# Patient Record
Sex: Female | Born: 1935 | Race: White | Hispanic: No | Smoking: Former smoker
Health system: Southern US, Community
[De-identification: ages and names within clinical notes are randomized; demographics above are authoritative.]

## PROBLEM LIST (undated history)

## (undated) ENCOUNTER — Emergency Department (HOSPITAL_COMMUNITY): Admission: EM | Payer: Medicare Other | Source: Home / Self Care

## (undated) DIAGNOSIS — E785 Hyperlipidemia, unspecified: Secondary | ICD-10-CM

## (undated) DIAGNOSIS — R21 Rash and other nonspecific skin eruption: Secondary | ICD-10-CM

## (undated) DIAGNOSIS — S8010XA Contusion of unspecified lower leg, initial encounter: Secondary | ICD-10-CM

## (undated) DIAGNOSIS — F329 Major depressive disorder, single episode, unspecified: Secondary | ICD-10-CM

## (undated) DIAGNOSIS — I10 Essential (primary) hypertension: Secondary | ICD-10-CM

## (undated) DIAGNOSIS — K219 Gastro-esophageal reflux disease without esophagitis: Secondary | ICD-10-CM

## (undated) DIAGNOSIS — J309 Allergic rhinitis, unspecified: Secondary | ICD-10-CM

## (undated) DIAGNOSIS — M545 Low back pain, unspecified: Secondary | ICD-10-CM

## (undated) DIAGNOSIS — J45909 Unspecified asthma, uncomplicated: Secondary | ICD-10-CM

## (undated) DIAGNOSIS — M810 Age-related osteoporosis without current pathological fracture: Secondary | ICD-10-CM

## (undated) DIAGNOSIS — S82891A Other fracture of right lower leg, initial encounter for closed fracture: Secondary | ICD-10-CM

## (undated) DIAGNOSIS — R5383 Other fatigue: Secondary | ICD-10-CM

## (undated) DIAGNOSIS — R079 Chest pain, unspecified: Secondary | ICD-10-CM

## (undated) DIAGNOSIS — K449 Diaphragmatic hernia without obstruction or gangrene: Secondary | ICD-10-CM

## (undated) DIAGNOSIS — F411 Generalized anxiety disorder: Secondary | ICD-10-CM

## (undated) DIAGNOSIS — E559 Vitamin D deficiency, unspecified: Secondary | ICD-10-CM

## (undated) DIAGNOSIS — L981 Factitial dermatitis: Secondary | ICD-10-CM

## (undated) DIAGNOSIS — G4700 Insomnia, unspecified: Secondary | ICD-10-CM

## (undated) DIAGNOSIS — L03115 Cellulitis of right lower limb: Secondary | ICD-10-CM

## (undated) DIAGNOSIS — R5381 Other malaise: Secondary | ICD-10-CM

## (undated) DIAGNOSIS — Z905 Acquired absence of kidney: Secondary | ICD-10-CM

## (undated) DIAGNOSIS — K5730 Diverticulosis of large intestine without perforation or abscess without bleeding: Secondary | ICD-10-CM

## (undated) DIAGNOSIS — F519 Sleep disorder not due to a substance or known physiological condition, unspecified: Secondary | ICD-10-CM

## (undated) DIAGNOSIS — A4901 Methicillin susceptible Staphylococcus aureus infection, unspecified site: Secondary | ICD-10-CM

## (undated) DIAGNOSIS — M069 Rheumatoid arthritis, unspecified: Secondary | ICD-10-CM

## (undated) DIAGNOSIS — L299 Pruritus, unspecified: Secondary | ICD-10-CM

## (undated) DIAGNOSIS — Z86010 Personal history of colonic polyps: Secondary | ICD-10-CM

## (undated) DIAGNOSIS — S42123A Displaced fracture of acromial process, unspecified shoulder, initial encounter for closed fracture: Secondary | ICD-10-CM

## (undated) DIAGNOSIS — M1710 Unilateral primary osteoarthritis, unspecified knee: Secondary | ICD-10-CM

## (undated) HISTORY — DX: Pruritus, unspecified: L29.9

## (undated) HISTORY — PX: OTHER SURGICAL HISTORY: SHX169

## (undated) HISTORY — PX: ABDOMINAL HYSTERECTOMY: SHX81

## (undated) HISTORY — PX: APPENDECTOMY: SHX54

## (undated) HISTORY — DX: Gastro-esophageal reflux disease without esophagitis: K21.9

## (undated) HISTORY — DX: Diaphragmatic hernia without obstruction or gangrene: K44.9

## (undated) HISTORY — DX: Generalized anxiety disorder: F41.1

## (undated) HISTORY — PX: CHOLECYSTECTOMY: SHX55

## (undated) HISTORY — DX: Major depressive disorder, single episode, unspecified: F32.9

## (undated) HISTORY — DX: Other fatigue: R53.83

## (undated) HISTORY — DX: Unilateral primary osteoarthritis, unspecified knee: M17.10

## (undated) HISTORY — DX: Sleep disorder not due to a substance or known physiological condition, unspecified: F51.9

## (undated) HISTORY — PX: TONSILLECTOMY: SUR1361

## (undated) HISTORY — DX: Chest pain, unspecified: R07.9

## (undated) HISTORY — DX: Contusion of unspecified lower leg, initial encounter: S80.10XA

## (undated) HISTORY — DX: Rash and other nonspecific skin eruption: R21

## (undated) HISTORY — DX: Other malaise: R53.81

## (undated) HISTORY — DX: Displaced fracture of acromial process, unspecified shoulder, initial encounter for closed fracture: S42.123A

## (undated) HISTORY — DX: Factitial dermatitis: L98.1

## (undated) HISTORY — DX: Age-related osteoporosis without current pathological fracture: M81.0

## (undated) HISTORY — DX: Insomnia, unspecified: G47.00

## (undated) HISTORY — DX: Rheumatoid arthritis, unspecified: M06.9

## (undated) HISTORY — DX: Acquired absence of kidney: Z90.5

## (undated) HISTORY — DX: Essential (primary) hypertension: I10

## (undated) HISTORY — DX: Hyperlipidemia, unspecified: E78.5

## (undated) HISTORY — DX: Personal history of colonic polyps: Z86.010

## (undated) HISTORY — DX: Vitamin D deficiency, unspecified: E55.9

## (undated) HISTORY — DX: Diverticulosis of large intestine without perforation or abscess without bleeding: K57.30

## (undated) HISTORY — DX: Low back pain, unspecified: M54.50

## (undated) HISTORY — DX: Allergic rhinitis, unspecified: J30.9

## (undated) HISTORY — DX: Low back pain: M54.5

## (undated) HISTORY — DX: Unspecified asthma, uncomplicated: J45.909

---

## 1999-05-22 ENCOUNTER — Encounter: Payer: Self-pay | Admitting: Orthopedic Surgery

## 1999-05-26 ENCOUNTER — Inpatient Hospital Stay (HOSPITAL_COMMUNITY): Admission: RE | Admit: 1999-05-26 | Discharge: 1999-05-30 | Payer: Self-pay | Admitting: Orthopedic Surgery

## 1999-05-26 ENCOUNTER — Encounter: Payer: Self-pay | Admitting: Orthopedic Surgery

## 1999-05-30 ENCOUNTER — Inpatient Hospital Stay (HOSPITAL_COMMUNITY)
Admission: RE | Admit: 1999-05-30 | Discharge: 1999-06-06 | Payer: Self-pay | Admitting: Physical Medicine & Rehabilitation

## 2000-08-13 ENCOUNTER — Ambulatory Visit (HOSPITAL_COMMUNITY): Admission: RE | Admit: 2000-08-13 | Discharge: 2000-08-13 | Payer: Self-pay

## 2003-02-16 ENCOUNTER — Encounter: Payer: Self-pay | Admitting: Internal Medicine

## 2004-09-12 ENCOUNTER — Ambulatory Visit: Payer: Self-pay | Admitting: Internal Medicine

## 2005-01-22 ENCOUNTER — Ambulatory Visit: Payer: Self-pay | Admitting: Internal Medicine

## 2005-04-03 ENCOUNTER — Ambulatory Visit: Payer: Self-pay | Admitting: Internal Medicine

## 2005-04-11 ENCOUNTER — Encounter: Admission: RE | Admit: 2005-04-11 | Discharge: 2005-04-11 | Payer: Self-pay | Admitting: Orthopedic Surgery

## 2005-07-25 ENCOUNTER — Ambulatory Visit: Payer: Self-pay | Admitting: Internal Medicine

## 2005-08-29 ENCOUNTER — Ambulatory Visit: Payer: Self-pay | Admitting: Internal Medicine

## 2005-10-02 ENCOUNTER — Ambulatory Visit: Payer: Self-pay | Admitting: Internal Medicine

## 2005-11-26 ENCOUNTER — Ambulatory Visit: Payer: Self-pay | Admitting: Internal Medicine

## 2006-01-14 ENCOUNTER — Ambulatory Visit: Payer: Self-pay | Admitting: Internal Medicine

## 2006-01-21 ENCOUNTER — Ambulatory Visit: Payer: Self-pay | Admitting: Internal Medicine

## 2006-06-04 ENCOUNTER — Ambulatory Visit: Payer: Self-pay | Admitting: Internal Medicine

## 2006-06-10 ENCOUNTER — Ambulatory Visit: Payer: Self-pay | Admitting: Family Medicine

## 2006-07-02 ENCOUNTER — Ambulatory Visit: Payer: Self-pay | Admitting: Internal Medicine

## 2006-08-27 ENCOUNTER — Ambulatory Visit: Payer: Self-pay | Admitting: Internal Medicine

## 2007-12-11 ENCOUNTER — Ambulatory Visit: Payer: Self-pay | Admitting: Internal Medicine

## 2007-12-11 DIAGNOSIS — F411 Generalized anxiety disorder: Secondary | ICD-10-CM

## 2007-12-11 DIAGNOSIS — J45909 Unspecified asthma, uncomplicated: Secondary | ICD-10-CM

## 2007-12-11 DIAGNOSIS — F519 Sleep disorder not due to a substance or known physiological condition, unspecified: Secondary | ICD-10-CM | POA: Insufficient documentation

## 2007-12-11 DIAGNOSIS — R5381 Other malaise: Secondary | ICD-10-CM

## 2007-12-11 DIAGNOSIS — J309 Allergic rhinitis, unspecified: Secondary | ICD-10-CM

## 2007-12-11 DIAGNOSIS — F3289 Other specified depressive episodes: Secondary | ICD-10-CM

## 2007-12-11 DIAGNOSIS — M069 Rheumatoid arthritis, unspecified: Secondary | ICD-10-CM

## 2007-12-11 DIAGNOSIS — K5730 Diverticulosis of large intestine without perforation or abscess without bleeding: Secondary | ICD-10-CM | POA: Insufficient documentation

## 2007-12-11 DIAGNOSIS — K219 Gastro-esophageal reflux disease without esophagitis: Secondary | ICD-10-CM

## 2007-12-11 DIAGNOSIS — M810 Age-related osteoporosis without current pathological fracture: Secondary | ICD-10-CM

## 2007-12-11 DIAGNOSIS — I10 Essential (primary) hypertension: Secondary | ICD-10-CM

## 2007-12-11 DIAGNOSIS — R5383 Other fatigue: Secondary | ICD-10-CM

## 2007-12-11 DIAGNOSIS — F329 Major depressive disorder, single episode, unspecified: Secondary | ICD-10-CM

## 2007-12-11 DIAGNOSIS — E785 Hyperlipidemia, unspecified: Secondary | ICD-10-CM | POA: Insufficient documentation

## 2007-12-11 HISTORY — DX: Other specified depressive episodes: F32.89

## 2007-12-11 HISTORY — DX: Rheumatoid arthritis, unspecified: M06.9

## 2007-12-11 HISTORY — DX: Generalized anxiety disorder: F41.1

## 2007-12-11 HISTORY — DX: Essential (primary) hypertension: I10

## 2007-12-11 HISTORY — DX: Gastro-esophageal reflux disease without esophagitis: K21.9

## 2007-12-11 HISTORY — DX: Unspecified asthma, uncomplicated: J45.909

## 2007-12-11 HISTORY — DX: Allergic rhinitis, unspecified: J30.9

## 2007-12-11 HISTORY — DX: Diverticulosis of large intestine without perforation or abscess without bleeding: K57.30

## 2007-12-11 HISTORY — DX: Hyperlipidemia, unspecified: E78.5

## 2007-12-11 HISTORY — DX: Sleep disorder not due to a substance or known physiological condition, unspecified: F51.9

## 2007-12-11 HISTORY — DX: Major depressive disorder, single episode, unspecified: F32.9

## 2007-12-11 HISTORY — DX: Other malaise: R53.81

## 2007-12-11 HISTORY — DX: Age-related osteoporosis without current pathological fracture: M81.0

## 2007-12-11 LAB — CONVERTED CEMR LAB
ALT: 14 units/L (ref 0–35)
AST: 21 units/L (ref 0–37)
Albumin: 4.2 g/dL (ref 3.5–5.2)
Alkaline Phosphatase: 40 units/L (ref 39–117)
BUN: 19 mg/dL (ref 6–23)
Basophils Absolute: 0.1 10*3/uL (ref 0.0–0.1)
Basophils Relative: 1.1 % — ABNORMAL HIGH (ref 0.0–1.0)
Bilirubin, Direct: 0.2 mg/dL (ref 0.0–0.3)
CO2: 31 meq/L (ref 19–32)
Calcium: 9.9 mg/dL (ref 8.4–10.5)
Chloride: 104 meq/L (ref 96–112)
Cholesterol: 207 mg/dL (ref 0–200)
Creatinine, Ser: 1 mg/dL (ref 0.4–1.2)
Direct LDL: 114.6 mg/dL
Eosinophils Absolute: 0.2 10*3/uL (ref 0.0–0.6)
Eosinophils Relative: 2.9 % (ref 0.0–5.0)
GFR calc Af Amer: 68 mL/min
GFR calc non Af Amer: 56 mL/min
Glucose, Bld: 93 mg/dL (ref 70–99)
HCT: 40.8 % (ref 36.0–46.0)
HDL: 57.9 mg/dL (ref >39.0)
Hemoglobin: 13.9 g/dL (ref 12.0–15.0)
Lymphocytes Relative: 35.7 % (ref 12.0–46.0)
MCHC: 34 g/dL (ref 30.0–36.0)
MCV: 95.1 fL (ref 78.0–100.0)
Monocytes Absolute: 0.5 10*3/uL (ref 0.2–0.7)
Monocytes Relative: 8.5 % (ref 3.0–11.0)
Neutro Abs: 2.8 10*3/uL (ref 1.4–7.7)
Neutrophils Relative %: 51.8 % (ref 43.0–77.0)
Platelets: 244 10*3/uL (ref 150–400)
Potassium: 4.4 meq/L (ref 3.5–5.1)
RBC: 4.29 M/uL (ref 3.87–5.11)
RDW: 11.7 % (ref 11.5–14.6)
Sodium: 142 meq/L (ref 135–145)
TSH: 1.97 microintl units/mL (ref 0.35–5.50)
Total Bilirubin: 1.5 mg/dL — ABNORMAL HIGH (ref 0.3–1.2)
Total CHOL/HDL Ratio: 3.6
Total Protein: 7.4 g/dL (ref 6.0–8.3)
Triglycerides: 129 mg/dL (ref 0–149)
VLDL: 26 mg/dL (ref 0–40)
WBC: 5.6 10*3/uL (ref 4.5–10.5)

## 2008-03-02 ENCOUNTER — Encounter: Payer: Self-pay | Admitting: Internal Medicine

## 2008-05-11 ENCOUNTER — Ambulatory Visit: Payer: Self-pay | Admitting: Endocrinology

## 2008-05-11 DIAGNOSIS — R21 Rash and other nonspecific skin eruption: Secondary | ICD-10-CM

## 2008-05-11 HISTORY — DX: Rash and other nonspecific skin eruption: R21

## 2008-06-08 ENCOUNTER — Ambulatory Visit: Payer: Self-pay | Admitting: Internal Medicine

## 2008-06-08 DIAGNOSIS — G4700 Insomnia, unspecified: Secondary | ICD-10-CM

## 2008-06-08 HISTORY — DX: Insomnia, unspecified: G47.00

## 2008-06-09 ENCOUNTER — Telehealth (INDEPENDENT_AMBULATORY_CARE_PROVIDER_SITE_OTHER): Payer: Self-pay | Admitting: *Deleted

## 2008-07-23 ENCOUNTER — Ambulatory Visit: Payer: Self-pay | Admitting: Internal Medicine

## 2008-07-23 ENCOUNTER — Encounter: Payer: Self-pay | Admitting: Internal Medicine

## 2008-08-04 ENCOUNTER — Telehealth (INDEPENDENT_AMBULATORY_CARE_PROVIDER_SITE_OTHER): Payer: Self-pay | Admitting: *Deleted

## 2008-09-09 ENCOUNTER — Telehealth (INDEPENDENT_AMBULATORY_CARE_PROVIDER_SITE_OTHER): Payer: Self-pay | Admitting: *Deleted

## 2008-09-13 ENCOUNTER — Telehealth: Payer: Self-pay | Admitting: Internal Medicine

## 2008-09-22 ENCOUNTER — Ambulatory Visit: Payer: Self-pay | Admitting: Internal Medicine

## 2008-12-27 ENCOUNTER — Telehealth (INDEPENDENT_AMBULATORY_CARE_PROVIDER_SITE_OTHER): Payer: Self-pay | Admitting: *Deleted

## 2009-01-19 ENCOUNTER — Ambulatory Visit: Payer: Self-pay | Admitting: Internal Medicine

## 2009-01-19 DIAGNOSIS — S8010XA Contusion of unspecified lower leg, initial encounter: Secondary | ICD-10-CM

## 2009-01-19 HISTORY — DX: Contusion of unspecified lower leg, initial encounter: S80.10XA

## 2009-01-19 LAB — CONVERTED CEMR LAB
ALT: 13 units/L (ref 0–35)
AST: 20 units/L (ref 0–37)
Albumin: 3.8 g/dL (ref 3.5–5.2)
Alkaline Phosphatase: 38 units/L — ABNORMAL LOW (ref 39–117)
BUN: 15 mg/dL (ref 6–23)
Basophils Absolute: 0.2 10*3/uL — ABNORMAL HIGH (ref 0.0–0.1)
Basophils Relative: 3.5 % — ABNORMAL HIGH (ref 0.0–3.0)
Bilirubin Urine: NEGATIVE
Bilirubin, Direct: 0.1 mg/dL (ref 0.0–0.3)
CO2: 32 meq/L (ref 19–32)
Calcium: 9.5 mg/dL (ref 8.4–10.5)
Chloride: 104 meq/L (ref 96–112)
Cholesterol: 185 mg/dL (ref 0–200)
Creatinine, Ser: 0.9 mg/dL (ref 0.4–1.2)
Eosinophils Absolute: 0.2 10*3/uL (ref 0.0–0.7)
Eosinophils Relative: 2.8 % (ref 0.0–5.0)
Folate: 6 ng/mL
GFR calc non Af Amer: 62.84 mL/min (ref >60)
Glucose, Bld: 88 mg/dL (ref 70–99)
HCT: 39.2 % (ref 36.0–46.0)
HDL: 49.5 mg/dL (ref >39.00)
Hemoglobin, Urine: NEGATIVE
Hemoglobin: 13.2 g/dL (ref 12.0–15.0)
Ketones, ur: NEGATIVE mg/dL
LDL Cholesterol: 110 mg/dL — ABNORMAL HIGH (ref 0–99)
Leukocytes, UA: NEGATIVE
Lymphocytes Relative: 23.6 % (ref 12.0–46.0)
Lymphs Abs: 1.5 10*3/uL (ref 0.7–4.0)
MCHC: 33.7 g/dL (ref 30.0–36.0)
MCV: 94 fL (ref 78.0–100.0)
Monocytes Absolute: 0.4 10*3/uL (ref 0.1–1.0)
Monocytes Relative: 5.7 % (ref 3.0–12.0)
Neutro Abs: 4.2 10*3/uL (ref 1.4–7.7)
Neutrophils Relative %: 64.4 % (ref 43.0–77.0)
Nitrite: NEGATIVE
Platelets: 239 10*3/uL (ref 150.0–400.0)
Potassium: 4.1 meq/L (ref 3.5–5.1)
RBC: 4.17 M/uL (ref 3.87–5.11)
RDW: 11.7 % (ref 11.5–14.6)
Sed Rate: 31 mm/hr — ABNORMAL HIGH (ref 0–22)
Sodium: 142 meq/L (ref 135–145)
Specific Gravity, Urine: <=1.005 (ref 1.000–1.030)
TSH: 1.6 microintl units/mL (ref 0.35–5.50)
Total Bilirubin: 0.7 mg/dL (ref 0.3–1.2)
Total CHOL/HDL Ratio: 4
Total Protein, Urine: NEGATIVE mg/dL
Total Protein: 6.9 g/dL (ref 6.0–8.3)
Triglycerides: 130 mg/dL (ref 0.0–149.0)
Urine Glucose: NEGATIVE mg/dL
Urobilinogen, UA: 0.2 (ref 0.0–1.0)
VLDL: 26 mg/dL (ref 0.0–40.0)
Vitamin B-12: 298 pg/mL (ref 211–911)
WBC: 6.5 10*3/uL (ref 4.5–10.5)
pH: 6 (ref 5.0–8.0)

## 2009-01-24 ENCOUNTER — Encounter: Payer: Self-pay | Admitting: Internal Medicine

## 2009-01-24 LAB — CONVERTED CEMR LAB: Vit D, 25-Hydroxy: 10 ng/mL — ABNORMAL LOW (ref 30–89)

## 2009-01-26 ENCOUNTER — Encounter: Payer: Self-pay | Admitting: Internal Medicine

## 2009-02-23 ENCOUNTER — Telehealth: Payer: Self-pay | Admitting: Internal Medicine

## 2009-07-20 ENCOUNTER — Ambulatory Visit: Payer: Self-pay | Admitting: Internal Medicine

## 2009-07-20 DIAGNOSIS — E559 Vitamin D deficiency, unspecified: Secondary | ICD-10-CM

## 2009-07-20 HISTORY — DX: Vitamin D deficiency, unspecified: E55.9

## 2010-01-11 ENCOUNTER — Encounter: Payer: Self-pay | Admitting: Internal Medicine

## 2010-01-18 ENCOUNTER — Ambulatory Visit: Payer: Self-pay | Admitting: Internal Medicine

## 2010-01-18 DIAGNOSIS — M1710 Unilateral primary osteoarthritis, unspecified knee: Secondary | ICD-10-CM

## 2010-01-18 DIAGNOSIS — IMO0002 Reserved for concepts with insufficient information to code with codable children: Secondary | ICD-10-CM | POA: Insufficient documentation

## 2010-01-18 HISTORY — DX: Reserved for concepts with insufficient information to code with codable children: IMO0002

## 2010-01-18 LAB — CONVERTED CEMR LAB
ALT: 13 units/L (ref 0–35)
AST: 18 units/L (ref 0–37)
Albumin: 3.8 g/dL (ref 3.5–5.2)
Alkaline Phosphatase: 39 units/L (ref 39–117)
BUN: 19 mg/dL (ref 6–23)
Basophils Absolute: 0.1 10*3/uL (ref 0.0–0.1)
Basophils Relative: 0.9 % (ref 0.0–3.0)
Bilirubin, Direct: 0.2 mg/dL (ref 0.0–0.3)
CO2: 33 meq/L — ABNORMAL HIGH (ref 19–32)
Calcium: 9.6 mg/dL (ref 8.4–10.5)
Chloride: 105 meq/L (ref 96–112)
Cholesterol: 182 mg/dL (ref 0–200)
Creatinine, Ser: 0.9 mg/dL (ref 0.4–1.2)
Eosinophils Absolute: 0.2 10*3/uL (ref 0.0–0.7)
Eosinophils Relative: 2.7 % (ref 0.0–5.0)
Folate: 5.6 ng/mL
GFR calc non Af Amer: 62.69 mL/min (ref >60)
Glucose, Bld: 89 mg/dL (ref 70–99)
HCT: 39 % (ref 36.0–46.0)
HDL: 68.2 mg/dL (ref >39.00)
Hemoglobin: 13.3 g/dL (ref 12.0–15.0)
Iron: 56 ug/dL (ref 42–145)
LDL Cholesterol: 87 mg/dL (ref 0–99)
Lymphocytes Relative: 30.7 % (ref 12.0–46.0)
Lymphs Abs: 2 10*3/uL (ref 0.7–4.0)
MCHC: 34 g/dL (ref 30.0–36.0)
MCV: 93.3 fL (ref 78.0–100.0)
Monocytes Absolute: 0.5 10*3/uL (ref 0.1–1.0)
Monocytes Relative: 8.1 % (ref 3.0–12.0)
Neutro Abs: 3.8 10*3/uL (ref 1.4–7.7)
Neutrophils Relative %: 57.6 % (ref 43.0–77.0)
Platelets: 227 10*3/uL (ref 150.0–400.0)
Potassium: 4.4 meq/L (ref 3.5–5.1)
RBC: 4.18 M/uL (ref 3.87–5.11)
RDW: 11.9 % (ref 11.5–14.6)
Saturation Ratios: 14.8 % — ABNORMAL LOW (ref 20.0–50.0)
Sed Rate: 29 mm/hr — ABNORMAL HIGH (ref 0–22)
Sodium: 144 meq/L (ref 135–145)
TSH: 2 microintl units/mL (ref 0.35–5.50)
Total Bilirubin: 0.6 mg/dL (ref 0.3–1.2)
Total CHOL/HDL Ratio: 3
Total Protein: 7.2 g/dL (ref 6.0–8.3)
Transferrin: 270.9 mg/dL (ref 212.0–360.0)
Triglycerides: 136 mg/dL (ref 0.0–149.0)
VLDL: 27.2 mg/dL (ref 0.0–40.0)
Vitamin B-12: 251 pg/mL (ref 211–911)
WBC: 6.6 10*3/uL (ref 4.5–10.5)

## 2010-01-19 LAB — CONVERTED CEMR LAB: Vit D, 25-Hydroxy: 32 ng/mL (ref 30–89)

## 2010-05-23 ENCOUNTER — Encounter (INDEPENDENT_AMBULATORY_CARE_PROVIDER_SITE_OTHER): Payer: Self-pay | Admitting: *Deleted

## 2010-06-12 ENCOUNTER — Encounter (INDEPENDENT_AMBULATORY_CARE_PROVIDER_SITE_OTHER): Payer: Self-pay | Admitting: *Deleted

## 2010-07-05 ENCOUNTER — Ambulatory Visit: Payer: Self-pay | Admitting: Internal Medicine

## 2010-07-05 DIAGNOSIS — N390 Urinary tract infection, site not specified: Secondary | ICD-10-CM

## 2010-07-05 LAB — CONVERTED CEMR LAB
Bilirubin Urine: NEGATIVE
Glucose, Urine, Semiquant: NEGATIVE
Ketones, urine, test strip: NEGATIVE
Nitrite: NEGATIVE
Protein, U semiquant: NEGATIVE
Specific Gravity, Urine: 1.01
Urobilinogen, UA: 0.2
pH: 7

## 2010-07-17 ENCOUNTER — Telehealth: Payer: Self-pay | Admitting: Internal Medicine

## 2010-07-18 ENCOUNTER — Ambulatory Visit: Payer: Self-pay | Admitting: Internal Medicine

## 2010-07-18 LAB — CONVERTED CEMR LAB
Bilirubin Urine: NEGATIVE
Hemoglobin, Urine: NEGATIVE
Nitrite: NEGATIVE
Total Protein, Urine: NEGATIVE mg/dL
Urine Glucose: NEGATIVE mg/dL
Urobilinogen, UA: 0.2 (ref 0.0–1.0)

## 2010-08-02 ENCOUNTER — Encounter: Payer: Self-pay | Admitting: Internal Medicine

## 2010-08-08 ENCOUNTER — Telehealth (INDEPENDENT_AMBULATORY_CARE_PROVIDER_SITE_OTHER): Payer: Self-pay | Admitting: *Deleted

## 2010-08-17 DIAGNOSIS — K449 Diaphragmatic hernia without obstruction or gangrene: Secondary | ICD-10-CM

## 2010-08-17 DIAGNOSIS — Z860100 Personal history of colon polyps, unspecified: Secondary | ICD-10-CM

## 2010-08-17 HISTORY — DX: Personal history of colon polyps, unspecified: Z86.0100

## 2010-08-17 HISTORY — DX: Diaphragmatic hernia without obstruction or gangrene: K44.9

## 2010-08-17 HISTORY — DX: Personal history of colonic polyps: Z86.010

## 2010-10-11 ENCOUNTER — Telehealth: Payer: Self-pay | Admitting: Internal Medicine

## 2010-11-07 ENCOUNTER — Telehealth: Payer: Self-pay | Admitting: Internal Medicine

## 2010-12-05 NOTE — Letter (Signed)
Summary: New Patient letter  [facility]  [address] [place], [place] [address]   Phone: [phone]  Fax: [phone]       06/12/2010 MRN: [id number]  Robin Arroyo [address] [place], [place]  [address]  Dear Robin Arroyo,  Welcome to the Gastroenterology Division at [facility].    You are scheduled to see Dr. Juanda Chance on 10/19/2011at 2:45pm on the 3rd floor at [facility], [address] [employer].  We ask that you try to arrive at our office 15 minutes prior to your appointment time to allow for check-in.  We would like you to complete the enclosed self-administered evaluation form prior to your visit and bring it with you on the day of your appointment.  We will review it with you.  Also, please bring a complete list of all your medications or, if you prefer, bring the medication bottles and we will list them.  Please bring your insurance card so that we may make a copy of it.  If your insurance requires a referral to see a specialist, please bring your referral form from your primary care physician.  Co-payments are due at the time of your visit and may be paid by cash, check or credit card.     Your office visit will consist of a consult with your physician (includes a physical exam), any laboratory testing he/she may order, scheduling of any necessary diagnostic testing (e.g. x-ray, ultrasound, CT-scan), and scheduling of a procedure (e.g. Endoscopy, Colonoscopy) if required.  Please allow enough time on your schedule to allow for any/all of these possibilities.    If you cannot keep your appointment, please call [phone] to cancel or reschedule prior to your appointment date.  This allows [place] the opportunity to schedule an appointment for another patient in need of care.  If you do not cancel or reschedule by 5 p.m. the business day prior to your appointment date, you will be charged a $50.00 late cancellation/no-show fee.    Thank you for choosing  [facility] for your medical needs.  We appreciate the opportunity to care for you.  Please visit [place] at our website  to learn more about our practice.                     Sincerely,                                                             The Gastroenterology Division

## 2010-12-05 NOTE — Progress Notes (Signed)
Summary: FORM   Phone Note Call from Patient Call back at Home Phone [phone]   Summary of Call: Patient is requesting handicapp renewal form completed. Can she drop them off at office for MD to complete? Or does she need office visit?  Initial call taken by: Lamar Sprinkles, CMA,  October 11, 2010 11:14 AM  Follow-up for Phone Call        done hardcopy to LIM side B - dahlia  Follow-up by: Corwin Levins MD,  October 11, 2010 12:32 PM  Additional Follow-up for Phone Call Additional follow up Details #1::        Pt informed, paperwork in cabinet for pt pick up Additional Follow-up by: Margaret Pyle, CMA,  October 11, 2010 12:53 PM

## 2010-12-05 NOTE — Progress Notes (Signed)
Summary: ABX?  Phone Note Call from Patient Call back at [facility] Phone [phone]   Caller: Patient Summary of Call: Pt called stating that she has finished ABX however, she still has low grade temp with mild back pain. Pt is requesting refill of ABX. Initial call taken by: Margaret Pyle, CMA,  July 17, 2010 9:22 AM  Follow-up for Phone Call        can we get repeat ua and culture - 588.1 Follow-up by: Corwin Levins MD,  July 17, 2010 10:18 AM  Additional Follow-up for Phone Call Additional follow up Details #1::        Pt made 2pm appt & orders in IDX for labs, pt aware. Additional Follow-up by: Verdell Face,  July 18, 2010 8:19 AM

## 2010-12-05 NOTE — Letter (Signed)
Summary: [facility]  [facility]   Imported By: Sherian Rein 02/07/2010 13:47:45  _____________________________________________________________________  External Attachment:    Type:   Image     Comment:   External Document

## 2010-12-05 NOTE — Letter (Signed)
Summary: [facility]  [facility]   Imported By: Lester Vail 08/10/2010 09:57:02  _____________________________________________________________________  External Attachment:    Type:   Image     Comment:   External Document

## 2010-12-05 NOTE — Assessment & Plan Note (Signed)
Summary: Gastroenterology  GRACELAND WACHTER MR#:  [id number] Page #    NAME:  Robin Arroyo, Robin Arroyo  OFFICE NO:  [id number]  DATE:  11/26/05  DOB:  2018-12-11  HISTORY OF PRESENT ILLNESS: The patient is an 77 year old white female who is here today for recurrent problems previously evaluated of gastroesophageal reflux and mostly hoarseness.  In 2002, she underwent evaluation for hoarseness and possible reflux, which included 24-hour esophageal pH probe, which was done off Nexium and was completely normal, with DeMaaster's score of 2 and 6.7.  She was then put on Nexium.  At that time her upper endoscopy showed hiatal hernia, which was partially prolapsing, 2 cm large.  There was no evidence of Barrett's esophagus.  The last endoscopy in April 2004 again showed a small hiatal hernia.  She was doing very well on Nexium 40 mg a day until her insurance stopped covering and she was switched to Prilosec OTC 20 mg a day, which she is on now.  She denies any dysphagia, odynophagia.  She has worsening of her hoarseness after she uses Flovent.  She denies dysphagia, although she does end up sometimes with food in her esophagus at night as she lies down.  Also, her symptoms are worse with stress; currently having stress with her son, trying to apply for disability.  She has lost about 10 pounds; she attributes it to stress.  MEDICATIONS: Relafen 2-3 times a week, Astelin 2 sprays q. 12 hours, Zyrtec 10 mg q. day, Flovent 45 mcg 2 puffs q. 12 hours, Xopenex 1.25 mg up to 3 times a day, Prilosec 20 mg q.a.m., albuterol.  PHYSICAL EXAMINATION: Blood pressure is 121/74.  Pulse is 68.  Weight is 158 pounds.  Her last weight was 162 pounds on January 07, 2003.  She is alert and oriented but her voice is raspy and hoarse.  Neck was supple.  Oral cavity appears normal.  Lungs with normal clear breath sounds; no wheezes or rales.  COR with normal S1, normal S2.  Abdomen was soft, nontender and mildly protuberant.  Rectal exam with  soft, Hemoccult-negative stool.   IMPRESSION:   1.  An 77 year old white female with chronic hoarseness, previously evaluated for gastroesophageal reflux, improved on proton pump inhibitors but her 24-hour pH probe was not diagnostic. 2.  History of previous laparoscopic cholecystectomy. 3.  Diverticulosis of the left colon.  PLAN:  Switch from Prilosec 1 a day to 2 a day for the next 6-8 weeks.  I have given her a new prescription.  If the symptoms do not improve, consider repeat upper endoscopy.       Hedwig Morton. Juanda Chance, M.D.  LKG/[id number] cc:  Dr. Kerby Callas       Dr. Annalee Genta       Dr. Oliver Barre D:  11/26/05; T:  ; Job [phone]

## 2010-12-05 NOTE — Letter (Signed)
Summary: Colonoscopy-Changed to Office Visit Letter  [facility]  [address] [place], [place] [address]   Phone: [phone]  Fax: [phone]      May 23, 2010 MRN: [id number]   Robin Arroyo [address] [place], [place]  [address]   Dear [name],   According to our records, it is time for you to schedule a Colonoscopy. However, after reviewing your medical record, I feel that an office visit would be most appropriate to more completely evaluate you and determine your need for a repeat procedure.  Please call [phone] (option #2) at your convenience to schedule an office visit. If you have any questions, concerns, or feel that this letter is in error, we would appreciate your call.   Sincerely,  Hedwig Morton. Juanda Chance, M.D.  [facility] Gastroenterology Division [phone]

## 2010-12-05 NOTE — Assessment & Plan Note (Signed)
Summary: 6 MO ROV /NWS  #   Vital Signs:  Patient profile:   77 year old female Height:      59 inches Weight:      152 pounds O2 Sat:      98 % on Room air Temp:     96.8 degrees F oral Pulse rate:   67 / minute BP sitting:   140 / 78  (left arm) Cuff size:   regular  Vitals Entered ByZella Ball Ewing (January 18, 2010 10:03 AM)  O2 Flow:  Room air  Preventive Care Screening  Mammogram:    Date:  02/03/2009    Results:  normal   CC: 6 Mo ROV/RE   CC:  6 Mo ROV/RE.  History of Present Illness: still with marked problems ambulating with her knees that she has declined the repeat knee replacements;  asks for pain control med;  walks with walker/with seat;  plans to see GI soon for persistent reflux symptoms and difficulty swallowing with occas cough but no pna;  BP at home usually < 140/90 and had to struggle today to get in from the parking lot ;  Pt denies CP, worsening sob, doe, wheezing, orthopnea, pnd, worsening LE edema, palps, dizziness or syncope   Here for wellness Diet: Heart Healthy or DM if diabetic Physical Activities: Sedentary Depression/mood screen: Negative Hearing: mild to mod loss bilat Visual Acuity: Grossly normal, wears glasses ADL's: Capable  Fall Risk: Mild Home Safety: Good End-of-Life Planning: Advance directive - DNRI agree   Preventive Screening-Counseling & Management      Drug Use:  no.    Problems Prior to Update: 1)  Fatigue  (ICD-780.79) 2)  Osteoarthritis, Knees, Bilateral  (ICD-715.96) 3)  Unspecified Vitamin D Deficiency  (ICD-268.9) 4)  Contusion, Lower Leg, Right  (ICD-924.10) 5)  Fatigue  (ICD-780.79) 6)  Insomnia-sleep Disorder-unspec  (ICD-780.52) 7)  Skin Rash  (ICD-782.1) 8)  Insomnia-sleep Disorder-unspec  (ICD-307.40) 9)  Colonic Polyps, Hx of  (ICD-V12.72) 10)  Allergic Rhinitis  (ICD-477.9) 11)  Anxiety  (ICD-300.00) 12)  Diverticulosis, Colon  (ICD-562.10) 13)  Depression  (ICD-311) 14)  Gerd  (ICD-530.81) 15)   Arthritis, Rheumatoid  (ICD-714.0) 16)  Asthma  (ICD-493.90) 17)  Osteoporosis  (ICD-733.00) 18)  Fatigue  (ICD-780.79) 19)  Hyperlipidemia  (ICD-272.4) 20)  Hypertension  (ICD-401.9)  Medications Prior to Update: 1)  Clotrimazole-Betamethasone 1-0.05 % Crea (Clotrimazole-Betamethasone) .... Use Asd Two Times A Day As Needed 2)  Lisinopril 20 Mg  Tabs (Lisinopril) .Marland Kitchen.. 1 By Mouth Qd 3)  Nabumetone 750 Mg Tabs (Nabumetone) .Marland Kitchen.. 1 By Mouth Two Times A Day Prn 4)  Nexium 40 Mg Cpdr (Esomeprazole Magnesium) .... Take 1 Capsule By Mouth Once A Day 5)  Flovent Hfa 44 Mcg/act  Aero (Fluticasone Propionate  Hfa) .... 2 Puffs Q12 Hrs 6)  Darvocet-N 100 100-650 Mg  Tabs (Propoxyphene N-Apap) .Marland Kitchen.. 1 By Mouth Qid As Needed 7)  Xopenex 1.25 Mg/22ml  Nebu (Levalbuterol Hcl) .... Use Asd Qid Prn 8)  Xyzal 5 Mg  Tabs (Levocetirizine Dihydrochloride) .Marland Kitchen.. 1 By Mouth Qd 9)  Alendronate Sodium 70 Mg Tabs (Alendronate Sodium) .Marland Kitchen.. 1po Q Wk 10)  Miralax  Powd (Polyethylene Glycol 3350) .Marland KitchenMarland KitchenMarland Kitchen 17 Gm I Water By Mouth Once Daily 11)  Vitamin D 1000 Unit Caps (Cholecalciferol) .Marland Kitchen.. 1po Once Daily 12)  Allergy Shots Weekly  Current Medications (verified): 1)  Clotrimazole-Betamethasone 1-0.05 % Crea (Clotrimazole-Betamethasone) .... Use Asd Two Times A Day As Needed 2)  Lisinopril  20 Mg  Tabs (Lisinopril) .Marland Kitchen.. 1 By Mouth Qd 3)  Nabumetone 750 Mg Tabs (Nabumetone) .Marland Kitchen.. 1 By Mouth Two Times A Day Prn 4)  Nexium 40 Mg Cpdr (Esomeprazole Magnesium) .... Take 1 Capsule By Mouth Once A Day 5)  Flovent Hfa 44 Mcg/act  Aero (Fluticasone Propionate  Hfa) .... 2 Puffs Q12 Hrs 6)  Tramadol Hcl 50 Mg Tabs (Tramadol Hcl) .Marland Kitchen.. 1 By Mouth Q 6 Hrs As Needed Pain 7)  Xopenex 1.25 Mg/45ml  Nebu (Levalbuterol Hcl) .... Use Asd Qid Prn 8)  Xyzal 5 Mg  Tabs (Levocetirizine Dihydrochloride) .Marland Kitchen.. 1 By Mouth Qd 9)  Alendronate Sodium 70 Mg Tabs (Alendronate Sodium) .Marland Kitchen.. 1po Q Wk 10)  Miralax  Powd (Polyethylene Glycol 3350) .Marland KitchenMarland KitchenMarland Kitchen 17 Gm I  Water By Mouth Once Daily 11)  Vitamin D 1000 Unit Caps (Cholecalciferol) .Marland Kitchen.. 1po Once Daily 12)  Allergy Shots Weekly  Allergies (verified): 1)  ! Celexa 2)  ! Remeron 3)  ! 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         all otherwise negative per pt -    Physical Exam  General:  alert and overweight-appearing.   Head:  normocephalic and atraumatic.   Eyes:  vision grossly intact, pupils equal, and pupils round.   Ears:  R ear normal and L ear normal.   Nose:  no external deformity and no nasal  discharge.   Mouth:  no gingival abnormalities and pharynx pink and moist.   Neck:  supple and no masses.   Lungs:  normal respiratory effort and normal breath sounds.   Heart:  normal rate and regular rhythm.   Abdomen:  soft, non-tender, and normal bowel sounds.   Msk:  no joint tenderness and no joint swelling.   Extremities:  no edema, no erythema  Neurologic:  alert & oriented X3 and cranial nerves II-XII intact.     Impression & Recommendations:  Problem # 1:  Preventive Health Care (ICD-V70.0)  Overall doing well, age appropriate education and counseling updated and referral for appropriate preventive services done unless declined, immunizations up to date or declined, diet counseling done if overweight, urged to quit smoking if smokes , most recent labs reviewed and current ordered if appropriate, ecg reviewed or declined (interpretation per ECG scanned in the EMR if done); information regarding Medicare Prevention requirements given if appropriate   Orders: First annual  wellness visit with prevention plan  (G3151)  Problem # 2:  HYPERTENSION (ICD-401.9)  Her updated medication list for this problem includes:    Lisinopril 20 Mg Tabs (Lisinopril) .Marland Kitchen... 1 by mouth qd  BP today: 140/78 Prior BP: 144/86 (07/20/2009)  Labs Reviewed: K+: 4.1 (01/19/2009) Creat: : 0.9 (01/19/2009)   Chol: 185 (01/19/2009)   HDL: 49.50 (01/19/2009)   LDL: 110 (01/19/2009)   TG: 130.0 (01/19/2009) stable overall by hx and exam, ok to continue meds/tx as is   Problem # 3:  OSTEOARTHRITIS, KNEES, BILATERAL (ICD-715.96)  Her updated medication list for this problem includes:    Nabumetone 750 Mg Tabs (Nabumetone) .Marland Kitchen... 1 by mouth two times a day prn    Tramadol Hcl 50 Mg Tabs (Tramadol hcl) .Marland Kitchen... 1 by mouth q 6 hrs as needed pain s/p bilat replcement, she declines repeat replacements; darvocet no longer on the [place] market;  to change to tramadol as needed   Orders: Prescription Created  Electronically [phone])  Problem # 4:  HYPERLIPIDEMIA (ICD-272.4)  Labs Reviewed: SGOT: 20 (01/19/2009)   SGPT: 13 (01/19/2009)   HDL:49.50 (01/19/2009), 57.9 (12/11/2007)  LDL:110 (01/19/2009), DEL (12/11/2007)  Chol:185 (01/19/2009), 207 (12/11/2007)  Trig:130.0 (01/19/2009), 129 (12/11/2007) stable overall by hx and exam, ok to continue meds/tx as is, declines further meds sucha as statin  Orders: TLB-Lipid Panel (80061-LIPID)  Problem # 5:  FATIGUE (ICD-780.79)  exam benign, to check labs below; follow with expectant management   Orders: TLB-BMP (Basic Metabolic Panel-BMET) (80048-METABOL) TLB-CBC Platelet - w/Differential (85025-CBCD) TLB-Hepatic/Liver Function Pnl (80076-HEPATIC) TLB-TSH (Thyroid Stimulating Hormone) (84443-TSH) TLB-Sedimentation Rate (ESR) (85652-ESR) TLB-IBC Pnl (Iron/FE;Transferrin) (83550-IBC) TLB-B12 + Folate Pnl (73710_62694-W54/OEV)  Complete Medication List: 1)  Clotrimazole-betamethasone 1-0.05 % Crea (Clotrimazole-betamethasone) .... Use asd two times a day as needed 2)  Lisinopril 20 Mg Tabs (Lisinopril) .Marland Kitchen.. 1 by mouth qd 3)  Nabumetone 750 Mg Tabs (Nabumetone) .Marland Kitchen.. 1 by mouth two times a day prn 4)  Nexium 40 Mg Cpdr (Esomeprazole magnesium) .... Take 1 capsule by mouth once a day 5)  Flovent Hfa 44 Mcg/act Aero (Fluticasone propionate  hfa) .... 2 puffs q12 hrs 6)  Tramadol Hcl 50 Mg Tabs (Tramadol hcl) .Marland Kitchen.. 1 by mouth q 6 hrs as needed pain 7)  Xopenex 1.25 Mg/63ml Nebu (Levalbuterol hcl) .... Use asd qid prn 8)  Xyzal 5 Mg Tabs (Levocetirizine dihydrochloride) .Marland Kitchen.. 1 by mouth qd 9)  Alendronate Sodium 70 Mg Tabs (Alendronate sodium) .Marland Kitchen.. 1po q wk 10)  Miralax Powd (Polyethylene glycol 3350) .Marland KitchenMarland Kitchen. 17 gm i water by mouth once daily 11)  Vitamin D 1000 Unit Caps (Cholecalciferol) .Marland Kitchen.. 1po once daily 12)  Allergy Shots Weekly   Other Orders: T-Vitamin D (25-Hydroxy) [phone])  Patient Instructions: 1)  Please take all new medications as  prescribed - the new pain medication as needed (the tramadol) 2)  stop the darvocet (propoxyphene) 3)  Continue all previous medications as before this visit  4)  Please go to the Lab in the basement for your blood and/or urine tests today 5)  Please schedule a follow-up appointment in 1 year or sooner if needed Prescriptions: TRAMADOL HCL 50 MG TABS (TRAMADOL HCL) 1 by mouth q 6 hrs as needed pain  #60 x 2   Entered and Authorized by:   Corwin Levins MD   Signed by:   Corwin Levins MD on 01/18/2010   Method used:   Print then Give to Patient   RxID:   [id number]

## 2010-12-05 NOTE — Assessment & Plan Note (Signed)
Summary: low grade fever/back pain/cd   Vital Signs:  Patient profile:   77 year old female Height:      60 inches Weight:      154.50 pounds BMI:     30.28 O2 Sat:      98 % on Room air Temp:     97.5 degrees F oral Pulse rate:   61 / minute BP sitting:   148 / 80  (left arm) Cuff size:   regular  Vitals Entered By: Zella Ball Ewing CMA (AAMA) (July 18, 2010 1:42 PM)  O2 Flow:  Room air CC: fever, chills, back pain/RE   CC:  fever, chills, and back pain/RE.  History of Present Illness: here to f/u out of abundance of caution - no overt GU symptoms such as pain, urgency, freq or hematuria, but has been feeling mild ill and cold/chilly and wants to re-check the urination as she dide not have GU symtpoms before as well;  Denies abd pain, back apin, n/v, high fevers, but has mild urinary incontinence at baseline - uses about 4 pads per day;  s/p bladder tack, does not want to do again.  No ST or cough and Pt denies CP, worsening sob, doe, wheezing, orthopnea, pnd, worsening LE edema, palps, dizziness or syncope  Pt denies new neuro symptoms such as headache, facial or extremity weakness  No recent falls, not confused today. Worried today b/c of solitary kidney.  No worseningwt loss, night sweats, loss of appetite or other constitutional symptoms  Denies worsening depressive symptoms or panic/anxiety.    Problems Prior to Update: 1)  Uti  (ICD-599.0) 2)  Uti  (ICD-599.0) 3)  Preventive Health Care  (ICD-V70.0) 4)  Fatigue  (ICD-780.79) 5)  Osteoarthritis, Knees, Bilateral  (ICD-715.96) 6)  Unspecified Vitamin D Deficiency  (ICD-268.9) 7)  Contusion, Lower Leg, Right  (ICD-924.10) 8)  Fatigue  (ICD-780.79) 9)  Insomnia-sleep Disorder-unspec  (ICD-780.52) 10)  Skin Rash  (ICD-782.1) 11)  Insomnia-sleep Disorder-unspec  (ICD-307.40) 12)  Colonic Polyps, Hx of  (ICD-V12.72) 13)  Allergic Rhinitis  (ICD-477.9) 14)  Anxiety  (ICD-300.00) 15)  Diverticulosis, Colon  (ICD-562.10) 16)   Depression  (ICD-311) 17)  Gerd  (ICD-530.81) 18)  Arthritis, Rheumatoid  (ICD-714.0) 19)  Asthma  (ICD-493.90) 20)  Osteoporosis  (ICD-733.00) 21)  Fatigue  (ICD-780.79) 22)  Hyperlipidemia  (ICD-272.4) 23)  Hypertension  (ICD-401.9)  Medications Prior to Update: 1)  Clotrimazole-Betamethasone 1-0.05 % Crea (Clotrimazole-Betamethasone) .... Use Asd Two Times A Day As Needed 2)  Lisinopril 20 Mg  Tabs (Lisinopril) .Marland Kitchen.. 1 By Mouth Qd 3)  Nabumetone 750 Mg Tabs (Nabumetone) .Marland Kitchen.. 1 By Mouth Two Times A Day Prn 4)  Nexium 40 Mg Cpdr (Esomeprazole Magnesium) .... Take 1 Capsule By Mouth Once A Day 5)  Flovent Hfa 44 Mcg/act  Aero (Fluticasone Propionate  Hfa) .... 2 Puffs Q12 Hrs 6)  Tramadol Hcl 50 Mg Tabs (Tramadol Hcl) .Marland Kitchen.. 1 By Mouth Q 6 Hrs As Needed Pain 7)  Xopenex 1.25 Mg/34ml  Nebu (Levalbuterol Hcl) .... Use Asd Qid Prn 8)  Xyzal 5 Mg  Tabs (Levocetirizine Dihydrochloride) .Marland Kitchen.. 1 By Mouth Qd 9)  Alendronate Sodium 70 Mg Tabs (Alendronate Sodium) .Marland Kitchen.. 1po Q Wk 10)  Miralax  Powd (Polyethylene Glycol 3350) .Marland KitchenMarland KitchenMarland Kitchen 17 Gm I Water By Mouth Once Daily 11)  Vitamin D 1000 Unit Caps (Cholecalciferol) .Marland Kitchen.. 1po Once Daily 12)  Allergy Shots Weekly 13)  Ciprofloxacin Hcl 500 Mg Tabs (Ciprofloxacin Hcl) .Marland Kitchen.. 1 By Mouth Two  Times A Day  Current Medications (verified): 1)  Clotrimazole-Betamethasone 1-0.05 % Crea (Clotrimazole-Betamethasone) .... Use Asd Two Times A Day As Needed 2)  Lisinopril 20 Mg  Tabs (Lisinopril) .Marland Kitchen.. 1 By Mouth Qd 3)  Nabumetone 750 Mg Tabs (Nabumetone) .Marland Kitchen.. 1 By Mouth Two Times A Day Prn 4)  Nexium 40 Mg Cpdr (Esomeprazole Magnesium) .... Take 1 Capsule By Mouth Once A Day 5)  Flovent Hfa 44 Mcg/act  Aero (Fluticasone Propionate  Hfa) .... 2 Puffs Q12 Hrs 6)  Tramadol Hcl 50 Mg Tabs (Tramadol Hcl) .Marland Kitchen.. 1 By Mouth Q 6 Hrs As Needed Pain 7)  Xopenex 1.25 Mg/46ml  Nebu (Levalbuterol Hcl) .... Use Asd Qid Prn 8)  Xyzal 5 Mg  Tabs (Levocetirizine Dihydrochloride) .Marland Kitchen.. 1 By Mouth  Qd 9)  Alendronate Sodium 70 Mg Tabs (Alendronate Sodium) .Marland Kitchen.. 1po Q Wk 10)  Miralax  Powd (Polyethylene Glycol 3350) .Marland KitchenMarland KitchenMarland Kitchen 17 Gm I Water By Mouth Once Daily 11)  Vitamin D 1000 Unit Caps (Cholecalciferol) .Marland Kitchen.. 1po Once Daily 12)  Allergy Shots Weekly  Allergies (verified): 1)  ! Celexa 2)  ! Remeron 3)  ! 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   Head:  normocephalic and atraumatic.   Eyes:  vision grossly intact, pupils equal, and pupils round.   Ears:  R ear normal and L ear normal.   Nose:  no external deformity and no nasal discharge.   Mouth:  no gingival abnormalities and pharynx pink and moist.   Neck:  supple and no masses.   Lungs:  normal respiratory effort, R decreased breath sounds, and L decreased breath sounds. but no wheezing or rales  Heart:  normal rate and regular rhythm.   Abdomen:  soft, non-tender, and normal bowel sounds.   Msk:  no flank tender Extremities:  no edema, no erythema  Skin:  color normal and no rashes.   Psych:  not depressed appearing and moderately anxious.     Impression & Recommendations:  Problem # 1:  UTI (ICD-599.0)  The following  medications were removed from the medication list:    Ciprofloxacin Hcl 500 Mg Tabs (Ciprofloxacin hcl) .Marland Kitchen... 1 by mouth two times a day  Orders: T-Culture, Urine ([id number])  recent UTI tx apparently successful, with some ? symptoms as per HPI recurring - for urine cx, hold further antibx pending urine studies  Problem # 2:  HYPERTENSION (ICD-401.9)  Her updated medication list for this problem includes:    Lisinopril 20 Mg Tabs (Lisinopril) .Marland Kitchen... 1 by mouth qd  BP today: 148/80 Prior BP: 134/72 (07/05/2010)  Labs Reviewed: K+: 4.4 (01/18/2010) Creat: : 0.9 (01/18/2010)   Chol: 182 (01/18/2010)   HDL: 68.20 (01/18/2010)   LDL: 87 (01/18/2010)   TG: 136.0 (01/18/2010) mild elev today, likely situational, ok to  follow, continue same treatment   Problem # 3:  ANXIETY (ICD-300.00) o/w stable overall by hx and exam, ok to continue meds/tx as is - declines further tx today such as ssri  Complete Medication List: 1)  Clotrimazole-betamethasone 1-0.05 % Crea (Clotrimazole-betamethasone) .... Use asd two times a day as needed 2)  Lisinopril 20 Mg Tabs (Lisinopril) .Marland Kitchen.. 1 by mouth qd 3)  Nabumetone 750 Mg Tabs (Nabumetone) .Marland Kitchen.. 1 by mouth two times a day prn 4)  Nexium 40 Mg Cpdr (Esomeprazole magnesium) .... Take 1 capsule by mouth once a day 5)  Flovent Hfa 44 Mcg/act Aero (Fluticasone propionate  hfa) .... 2 puffs q12 hrs 6)  Tramadol Hcl 50 Mg Tabs (Tramadol hcl) .Marland Kitchen.. 1 by mouth q 6 hrs as needed pain 7)  Xopenex 1.25 Mg/20ml Nebu (Levalbuterol hcl) .... Use asd qid prn 8)  Xyzal 5 Mg Tabs (Levocetirizine dihydrochloride) .Marland Kitchen.. 1 by mouth qd 9)  Alendronate Sodium 70 Mg Tabs (Alendronate sodium) .Marland Kitchen.. 1po q wk 10)  Miralax Powd (Polyethylene glycol 3350) .Marland KitchenMarland Kitchen. 17 gm i water by mouth once daily 11)  Vitamin D 1000 Unit Caps (Cholecalciferol) .Marland Kitchen.. 1po once daily 12)  Allergy Shots Weekly   Other Orders: Flu Vaccine 80yrs + MEDICARE PATIENTS ([id number]) Administration Flu vaccine - MCR  ([id number])  Patient Instructions: 1)  you had the flu shot today 2)  we will order the urine culture on the urine sample 3)  we will call if you need further antibiotic 4)  Continue all previous medications as before this visit 5)  Please schedule a follow-up appointment as needed, or Mar 2012 for your "yearly medicare exam"    Flu Vaccine Consent Questions     Do you have a history of severe allergic reactions to this vaccine? no    Any prior history of allergic reactions to egg and/or gelatin? no    Do you have a sensitivity to the preservative Thimersol? no    Do you have a past history of Guillan-Barre Syndrome? no    Do you currently have an acute febrile illness? no    Have you ever had a severe reaction to latex? no    Vaccine information given and explained to patient? yes    Are you currently pregnant? no    Lot Number:[id number]   Exp Date:05/05/2011   Site Given  Left Deltoid IMlu

## 2010-12-05 NOTE — Progress Notes (Signed)
Summary: Supporting lab dx code  ---- Converted from flag ---- ---- 08/07/2010 8:26 AM, Corwin Levins MD wrote: I dont understand b/c I ordered the test under 599.0 per my documentation;  I think that should work;  ? maybe some kind of admin error where a wrong code added for the reason for the test?  ---- 08/04/2010 2:40 PM, Leodis Liverpool wrote: Please send supporting dx code for urine cx, denied 588.1. Thanks Darl Pikes ------------------------------

## 2010-12-05 NOTE — Procedures (Signed)
Summary: COLON   Colonoscopy  Procedure date:  02/16/2003  Findings:      Location:  [facility].    Procedures Next Due Date:    Colonoscopy: 02/2008 Patient Name: Kariann, Wecker MRN:  Procedure Procedures: Colonoscopy CPT: 16109.    with polypectomy. CPT: A3573898.  Personnel: Endoscopist: Dora L. Juanda Chance, MD.  Exam Location: Exam performed in Outpatient Clinic. Outpatient  Patient Consent: Procedure, Alternatives, Risks and Benefits discussed, consent obtained, from patient. Consent was obtained by the RN.  Indications  Average Risk Screening Routine.  History  Pre-Exam Physical: Performed Feb 16, 2003. Cardio-pulmonary exam, Rectal exam, HEENT exam , Abdominal exam, Extremity exam, Neurological exam, Mental status exam WNL.  Exam Exam: Extent of exam reached: Cecum, extent intended: Cecum.  The cecum was identified by appendiceal orifice and IC valve. Colon retroflexion performed. Images taken. ASA Classification: II. Tolerance: good.  Monitoring: Pulse and BP monitoring, Oximetry used. Supplemental O2 given.  Colon Prep Used Golytely for colon prep. Prep results: good.  Sedation Meds: Patient assessed and found to be appropriate for moderate (conscious) sedation. Fentanyl 50 mcg. given IV. Versed 2 mg. given IV.  Findings POLYP: Sigmoid Colon, Maximum size: 8 mm. sessile polyp. Distance from Anus 25 cm. Procedure:  snare with cautery, removed, retrieved, Polyp sent to pathology. ICD9: Colon Polyps: 211.3.  - DIVERTICULOSIS: Sigmoid Colon. ICD9: Diverticulosis: 562.10. Comments: mild to moderare.   Assessment Abnormal examination, see findings above.  Diagnoses: 562.10: Diverticulosis.  211.3: Colon Polyps.   Comments: s/p polypectomy Events  Unplanned Interventions: No intervention was required.  Unplanned Events: There were no complications. Plans  Post Exam Instructions: No aspirin or non-steroidal containing medications: 2  weeks.  Medication Plan: Await pathology.  Patient Education: Patient given standard instructions for: Polyps. Yearly hemoccult testing recommended. Patient instructed to get routine colonoscopy every 5 years.  Disposition: After procedure patient sent to recovery. After recovery patient sent home.   This report was created from the original endoscopy report, which was reviewed and signed by the above listed endoscopist.

## 2010-12-05 NOTE — Assessment & Plan Note (Signed)
Summary: C/O CYSTITIS AND BLADDER ISSUES./KB   Vital Signs:  Patient profile:   77 year old female Height:      59 inches Weight:      153.25 pounds BMI:     31.06 O2 Sat:      95 % on Room air Temp:     97.1 degrees F oral Pulse rate:   66 / minute BP sitting:   134 / 72  (left arm) Cuff size:   regular  Vitals Entered By: Zella Ball Ewing CMA Duncan Dull) (July 05, 2010 3:47 PM)  O2 Flow:  Room air CC: Pain when urinating/RE   CC:  Pain when urinating/RE.  History of Present Illness: here for acute visit,  now wheelchair bound due to chronic knee pain (not eligible for repeat bilat knee TKR) but very sharp and cognitive intact;  states has chronic mild urinary incontinne, wears pads,  and in the past wk with grad worsening lower abd discomfort, and today with marked dysuria, urgency and freq, worsening incontinence despite cranberry juice use;  denies worsening back pain, n/v, high fever or chills.  Last UTI> 6 mo.  Pt denies CP, worsening sob, doe, wheezing, orthopnea, pnd, worsening LE edema, palps, dizziness or syncope  Pt denies new neuro symptoms such as headache, facial or extremity weakness   Cant sleep well last night due to abd discomfort, denies worsening depressive symptoms or suicidal ideation, or anxiety.  Problems Prior to Update: 1)  Uti  (ICD-599.0) 2)  Preventive Health Care  (ICD-V70.0) 3)  Fatigue  (ICD-780.79) 4)  Osteoarthritis, Knees, Bilateral  (ICD-715.96) 5)  Unspecified Vitamin D Deficiency  (ICD-268.9) 6)  Contusion, Lower Leg, Right  (ICD-924.10) 7)  Fatigue  (ICD-780.79) 8)  Insomnia-sleep Disorder-unspec  (ICD-780.52) 9)  Skin Rash  (ICD-782.1) 10)  Insomnia-sleep Disorder-unspec  (ICD-307.40) 11)  Colonic Polyps, Hx of  (ICD-V12.72) 12)  Allergic Rhinitis  (ICD-477.9) 13)  Anxiety  (ICD-300.00) 14)  Diverticulosis, Colon  (ICD-562.10) 15)  Depression  (ICD-311) 16)  Gerd  (ICD-530.81) 17)  Arthritis, Rheumatoid  (ICD-714.0) 18)  Asthma   (ICD-493.90) 19)  Osteoporosis  (ICD-733.00) 20)  Fatigue  (ICD-780.79) 21)  Hyperlipidemia  (ICD-272.4) 22)  Hypertension  (ICD-401.9)  Medications Prior to Update: 1)  Clotrimazole-Betamethasone 1-0.05 % Crea (Clotrimazole-Betamethasone) .... Use Asd Two Times A Day As Needed 2)  Lisinopril 20 Mg  Tabs (Lisinopril) .Marland Kitchen.. 1 By Mouth Qd 3)  Nabumetone 750 Mg Tabs (Nabumetone) .Marland Kitchen.. 1 By Mouth Two Times A Day Prn 4)  Nexium 40 Mg Cpdr (Esomeprazole Magnesium) .... Take 1 Capsule By Mouth Once A Day 5)  Flovent Hfa 44 Mcg/act  Aero (Fluticasone Propionate  Hfa) .... 2 Puffs Q12 Hrs 6)  Tramadol Hcl 50 Mg Tabs (Tramadol Hcl) .Marland Kitchen.. 1 By Mouth Q 6 Hrs As Needed Pain 7)  Xopenex 1.25 Mg/55ml  Nebu (Levalbuterol Hcl) .... Use Asd Qid Prn 8)  Xyzal 5 Mg  Tabs (Levocetirizine Dihydrochloride) .Marland Kitchen.. 1 By Mouth Qd 9)  Alendronate Sodium 70 Mg Tabs (Alendronate Sodium) .Marland Kitchen.. 1po Q Wk 10)  Miralax  Powd (Polyethylene Glycol 3350) .Marland KitchenMarland KitchenMarland Kitchen 17 Gm I Water By Mouth Once Daily 11)  Vitamin D 1000 Unit Caps (Cholecalciferol) .Marland Kitchen.. 1po Once Daily 12)  Allergy Shots Weekly  Current Medications (verified): 1)  Clotrimazole-Betamethasone 1-0.05 % Crea (Clotrimazole-Betamethasone) .... Use Asd Two Times A Day As Needed 2)  Lisinopril 20 Mg  Tabs (Lisinopril) .Marland Kitchen.. 1 By Mouth Qd 3)  Nabumetone 750 Mg Tabs (Nabumetone) .Marland KitchenMarland KitchenMarland Kitchen 1  By Mouth Two Times A Day Prn 4)  Nexium 40 Mg Cpdr (Esomeprazole Magnesium) .... Take 1 Capsule By Mouth Once A Day 5)  Flovent Hfa 44 Mcg/act  Aero (Fluticasone Propionate  Hfa) .... 2 Puffs Q12 Hrs 6)  Tramadol Hcl 50 Mg Tabs (Tramadol Hcl) .Marland Kitchen.. 1 By Mouth Q 6 Hrs As Needed Pain 7)  Xopenex 1.25 Mg/47ml  Nebu (Levalbuterol Hcl) .... Use Asd Qid Prn 8)  Xyzal 5 Mg  Tabs (Levocetirizine Dihydrochloride) .Marland Kitchen.. 1 By Mouth Qd 9)  Alendronate Sodium 70 Mg Tabs (Alendronate Sodium) .Marland Kitchen.. 1po Q Wk 10)  Miralax  Powd (Polyethylene Glycol 3350) .Marland KitchenMarland KitchenMarland Kitchen 17 Gm I Water By Mouth Once Daily 11)  Vitamin D 1000 Unit Caps  (Cholecalciferol) .Marland Kitchen.. 1po Once Daily 12)  Allergy Shots Weekly 13)  Ciprofloxacin Hcl 500 Mg Tabs (Ciprofloxacin Hcl) .Marland Kitchen.. 1 By Mouth Two Times A Day  Allergies (verified): 1)  ! Celexa 2)  ! Remeron 3)  ! 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   Head:  normocephalic and atraumatic.   Eyes:  vision grossly intact, pupils equal, and pupils round.   Ears:  R ear normal and L ear normal.   Nose:  no external deformity and no nasal discharge.   Mouth:  no gingival abnormalities and pharynx pink and moist.   Neck:  supple and no masses.   Lungs:  normal respiratory effort and normal breath sounds.   Heart:  normal rate and regular rhythm.   Abdomen:  soft and normal bowel sounds.  with mild lower mid abd tender, without guarding or rebound Extremities:  no edema, no erythema  Neurologic:  no confusion Psych:  not anxious appearing and not depressed appearing.     Impression & Recommendations:  Problem # 1:  UTI (ICD-599.0)  Her updated medication list for this problem includes:    Ciprofloxacin Hcl 500 Mg Tabs (Ciprofloxacin hcl) .Marland Kitchen... 1 by mouth two times a day  Orders: T-Culture, Urine ([id number]) mild to  mod, for urine cx, and treat as above, f/u any worsening signs or symptoms   Problem # 2:  HYPERTENSION (ICD-401.9)  Her updated medication list for this problem includes:    Lisinopril 20 Mg Tabs (Lisinopril) .Marland Kitchen... 1 by mouth qd  BP today: 134/72 Prior BP: 140/78 (01/18/2010)  Labs Reviewed: K+: 4.4 (01/18/2010) Creat: : 0.9 (01/18/2010)   Chol: 182 (01/18/2010)   HDL: 68.20 (01/18/2010)   LDL: 87 (01/18/2010)   TG: 136.0 (01/18/2010) stable overall by hx and exam, ok to continue meds/tx as is   Problem # 3:  DEPRESSION (ICD-311) stable overall by hx and exam, ok to continue meds/tx as is - declines need for med today such as SSRI  Complete Medication List: 1)  Clotrimazole-betamethasone 1-0.05 % Crea (Clotrimazole-betamethasone) .... Use asd two times a day  as needed 2)  Lisinopril 20 Mg Tabs (Lisinopril) .Marland Kitchen.. 1 by mouth qd 3)  Nabumetone 750 Mg Tabs (Nabumetone) .Marland Kitchen.. 1 by mouth two times a day prn 4)  Nexium 40 Mg Cpdr (Esomeprazole magnesium) .... Take 1 capsule by mouth once a day 5)  Flovent Hfa 44 Mcg/act Aero (Fluticasone propionate  hfa) .... 2 puffs q12 hrs 6)  Tramadol Hcl 50 Mg Tabs (Tramadol hcl) .Marland Kitchen.. 1 by mouth q 6 hrs as needed pain 7)  Xopenex 1.25 Mg/98ml Nebu (Levalbuterol hcl) .... Use asd qid prn 8)  Xyzal 5 Mg Tabs (Levocetirizine dihydrochloride) .Marland Kitchen.. 1 by mouth qd 9)  Alendronate Sodium 70 Mg Tabs (Alendronate sodium) .Marland Kitchen.. 1po q wk 10)  Miralax Powd (Polyethylene glycol 3350) .Marland KitchenMarland Kitchen. 17 gm i water by mouth once daily 11)  Vitamin D 1000 Unit Caps (Cholecalciferol) .Marland Kitchen.. 1po once daily 12)  Allergy Shots Weekly  13)  Ciprofloxacin Hcl 500 Mg Tabs (Ciprofloxacin hcl) .Marland Kitchen.. 1 by mouth two times a day  Other Orders: UA Dipstick W/ Micro (manual) (16109)  Patient Instructions: 1)  Please take all new medications as prescribed 2)  Continue all previous medications as before this visit  3)  Your urine will be sent for the culture today 4)  Please call the number on  the [facility] for results of your testing  5)  Please schedule a follow-up appointment in March 2012 for your "yearly medicare exam" Prescriptions: CIPROFLOXACIN HCL 500 MG TABS (CIPROFLOXACIN HCL) 1 by mouth two times a day  #20 x 0   Entered and Authorized by:   Corwin Levins MD   Signed by:   Corwin Levins MD on 07/05/2010   Method used:   Print then Give to Patient   RxID:   [id number]   Laboratory Results   Urine Tests    Routine Urinalysis   Color: yellow Appearance: Clear Glucose: negative   (Normal Range: Negative) Bilirubin: negative   (Normal Range: Negative) Ketone: negative   (Normal Range: Negative) Spec. Gravity: 1.010   (Normal Range: 1.003-1.035) Blood: moderate   (Normal Range: Negative) pH: 7.0   (Normal Range: 5.0-8.0) Protein: negative   (Normal Range: Negative) Urobilinogen: 0.2   (Normal Range: 0-1) Nitrite: negative   (Normal Range: Negative) Leukocyte Esterace: large   (Normal Range: Negative)

## 2010-12-05 NOTE — Procedures (Signed)
Summary: EGD   EGD  Procedure date:  02/16/2003  Findings:      Location: [facility]   Patient Name: Robin Arroyo, Robin Arroyo MRN:  Procedure Procedures: Panendoscopy (EGD) CPT: 43235.    with biopsy(s)/brushing(s). CPT: D1846139.  Personnel: Endoscopist: Dora L. Juanda Chance, MD.  Exam Location: Exam performed in Outpatient Clinic. Outpatient  Patient Consent: Procedure, Alternatives, Risks and Benefits discussed, consent obtained, from patient. Consent was obtained by the RN.  Indications Symptoms: Reflux symptoms for 1-5 yrs, occurring daily.  Surveillance of: 2001.  History  Pre-Exam Physical: Performed Feb 16, 2003  Cardio-pulmonary exam, HEENT exam, Abdominal exam, Extremity exam, Neurological exam, Mental status exam WNL.  Exam Exam Info: Maximum depth of insertion Duodenum, intended Duodenum. Vocal cords visualized. Gastric retroflexion performed. Images taken. ASA Classification: II. Tolerance: good.  Sedation Meds: Patient assessed and found to be appropriate for moderate (conscious) sedation. Fentanyl 25 mcg. given IV. Versed 5 mg. given IV. Cetacaine Spray 2 sprays given aerosolized.  Monitoring: BP and pulse monitoring done. Oximetry used. Supplemental O2 given  Findings - HIATAL HERNIA: Diaphragm 35 cm from mouth. Z-line/GE Junction 33 cm from mouth. Prolapsing, 2 cms. in length. short tongue of gastric mucosa. ICD9: Hernia, Hiatal: 553.3.Comments: biopsies taken to r/o Barret's esophagus.   Assessment Abnormal examination, see findings above.  Diagnoses: 553.3: Hernia, Hiatal.   Comments: s/p bx's to r/o Barrett's esophagus Events  Unplanned Intervention: No unplanned interventions were required.  Unplanned Events: There were no complications. Plans Medication(s): Await pathology. Continue current medications.  Patient Education: Patient given standard instructions for: Reflux.  Disposition: After procedure patient sent to recovery.  After recovery patient sent home.   This report was created from the original endoscopy report, which was reviewed and signed by the above listed endoscopist.

## 2010-12-07 NOTE — Progress Notes (Signed)
Summary: Schedule Office Visit   Phone Note Outgoing Call Call back at [facility] Phone [phone]   Call placed by: Harlow Mares CMA Duncan Dull),  November 07, 2010 12:13 PM Call placed to: Patient Summary of Call: patient no showed her last office visit due to rain. I have left a message for her to call back and schedule an office visit to discuss colonoscopy. Initial call taken by: Harlow Mares CMA (AAMA),  November 07, 2010 12:13 PM  Follow-up for Phone Call        pt. returned call and does not want to schedule an appt. until she talks with Dr. Jonny Ruiz first. Follow-up by: Karna Christmas, [facility]

## 2011-01-10 ENCOUNTER — Other Ambulatory Visit: Payer: Self-pay | Admitting: Internal Medicine

## 2011-01-10 ENCOUNTER — Other Ambulatory Visit: Payer: Medicare Other

## 2011-01-10 ENCOUNTER — Encounter: Payer: Self-pay | Admitting: Internal Medicine

## 2011-01-10 ENCOUNTER — Ambulatory Visit (INDEPENDENT_AMBULATORY_CARE_PROVIDER_SITE_OTHER): Payer: Medicare Other | Admitting: Internal Medicine

## 2011-01-10 DIAGNOSIS — L981 Factitial dermatitis: Secondary | ICD-10-CM

## 2011-01-10 DIAGNOSIS — L299 Pruritus, unspecified: Secondary | ICD-10-CM

## 2011-01-10 DIAGNOSIS — R5381 Other malaise: Secondary | ICD-10-CM

## 2011-01-10 DIAGNOSIS — R5383 Other fatigue: Secondary | ICD-10-CM

## 2011-01-10 DIAGNOSIS — E785 Hyperlipidemia, unspecified: Secondary | ICD-10-CM

## 2011-01-10 DIAGNOSIS — F411 Generalized anxiety disorder: Secondary | ICD-10-CM

## 2011-01-10 HISTORY — DX: Factitial dermatitis: L98.1

## 2011-01-10 HISTORY — DX: Pruritus, unspecified: L29.9

## 2011-01-10 LAB — CBC WITH DIFFERENTIAL/PLATELET
Basophils Absolute: 0 10*3/uL (ref 0.0–0.1)
Eosinophils Absolute: 0.1 10*3/uL (ref 0.0–0.7)
Hemoglobin: 13.8 g/dL (ref 12.0–15.0)
Lymphocytes Relative: 30.3 % (ref 12.0–46.0)
Lymphs Abs: 1.7 10*3/uL (ref 0.7–4.0)
MCHC: 34.6 g/dL (ref 30.0–36.0)
Neutro Abs: 3.2 10*3/uL (ref 1.4–7.7)
RDW: 13.4 % (ref 11.5–14.6)

## 2011-01-10 LAB — HEPATIC FUNCTION PANEL: Albumin: 3.8 g/dL (ref 3.5–5.2)

## 2011-01-10 LAB — LIPID PANEL
Cholesterol: 164 mg/dL (ref 0–200)
Triglycerides: 111 mg/dL (ref 0.0–149.0)

## 2011-01-10 LAB — BASIC METABOLIC PANEL
BUN: 17 mg/dL (ref 6–23)
CO2: 30 mEq/L (ref 19–32)
Calcium: 10 mg/dL (ref 8.4–10.5)
Glucose, Bld: 91 mg/dL (ref 70–99)
Sodium: 141 mEq/L (ref 135–145)

## 2011-01-10 LAB — TSH: TSH: 1.43 u[IU]/mL (ref 0.35–5.50)

## 2011-01-15 ENCOUNTER — Other Ambulatory Visit: Payer: Self-pay | Admitting: Internal Medicine

## 2011-01-15 ENCOUNTER — Ambulatory Visit (INDEPENDENT_AMBULATORY_CARE_PROVIDER_SITE_OTHER): Payer: Medicare Other | Admitting: Internal Medicine

## 2011-01-15 ENCOUNTER — Ambulatory Visit (INDEPENDENT_AMBULATORY_CARE_PROVIDER_SITE_OTHER)
Admission: RE | Admit: 2011-01-15 | Discharge: 2011-01-15 | Disposition: A | Discharge status: Home / Self Care | Payer: Medicare Other | Source: Ambulatory Visit | Attending: Internal Medicine | Admitting: Internal Medicine

## 2011-01-15 ENCOUNTER — Encounter: Payer: Self-pay | Admitting: Internal Medicine

## 2011-01-15 ENCOUNTER — Telehealth: Payer: Self-pay | Admitting: Internal Medicine

## 2011-01-15 DIAGNOSIS — R079 Chest pain, unspecified: Secondary | ICD-10-CM

## 2011-01-15 DIAGNOSIS — L299 Pruritus, unspecified: Secondary | ICD-10-CM

## 2011-01-15 DIAGNOSIS — K219 Gastro-esophageal reflux disease without esophagitis: Secondary | ICD-10-CM

## 2011-01-15 DIAGNOSIS — L981 Factitial dermatitis: Secondary | ICD-10-CM

## 2011-01-15 HISTORY — DX: Chest pain, unspecified: R07.9

## 2011-01-16 ENCOUNTER — Telehealth: Payer: Self-pay | Admitting: Internal Medicine

## 2011-01-19 ENCOUNTER — Telehealth: Payer: Self-pay | Admitting: Internal Medicine

## 2011-01-21 ENCOUNTER — Inpatient Hospital Stay (HOSPITAL_COMMUNITY)
Admission: EM | Admit: 2011-01-21 | Discharge: 2011-01-25 | DRG: 392 | Disposition: A | Discharge status: Skilled Nursing Facility | Payer: Medicare Other | Attending: Internal Medicine | Admitting: Internal Medicine

## 2011-01-21 DIAGNOSIS — B952 Enterococcus as the cause of diseases classified elsewhere: Secondary | ICD-10-CM | POA: Diagnosis present

## 2011-01-21 DIAGNOSIS — R197 Diarrhea, unspecified: Secondary | ICD-10-CM | POA: Diagnosis present

## 2011-01-21 DIAGNOSIS — Z96659 Presence of unspecified artificial knee joint: Secondary | ICD-10-CM

## 2011-01-21 DIAGNOSIS — M810 Age-related osteoporosis without current pathological fracture: Secondary | ICD-10-CM | POA: Diagnosis present

## 2011-01-21 DIAGNOSIS — E785 Hyperlipidemia, unspecified: Secondary | ICD-10-CM | POA: Diagnosis present

## 2011-01-21 DIAGNOSIS — I10 Essential (primary) hypertension: Secondary | ICD-10-CM | POA: Diagnosis present

## 2011-01-21 DIAGNOSIS — J45909 Unspecified asthma, uncomplicated: Secondary | ICD-10-CM | POA: Diagnosis present

## 2011-01-21 DIAGNOSIS — N390 Urinary tract infection, site not specified: Secondary | ICD-10-CM | POA: Diagnosis present

## 2011-01-21 DIAGNOSIS — E871 Hypo-osmolality and hyponatremia: Secondary | ICD-10-CM | POA: Diagnosis present

## 2011-01-21 DIAGNOSIS — M069 Rheumatoid arthritis, unspecified: Secondary | ICD-10-CM | POA: Diagnosis present

## 2011-01-21 DIAGNOSIS — F341 Dysthymic disorder: Secondary | ICD-10-CM | POA: Diagnosis present

## 2011-01-21 DIAGNOSIS — N179 Acute kidney failure, unspecified: Secondary | ICD-10-CM | POA: Diagnosis present

## 2011-01-21 DIAGNOSIS — K219 Gastro-esophageal reflux disease without esophagitis: Principal | ICD-10-CM | POA: Diagnosis present

## 2011-01-21 DIAGNOSIS — K5730 Diverticulosis of large intestine without perforation or abscess without bleeding: Secondary | ICD-10-CM | POA: Diagnosis present

## 2011-01-21 LAB — BASIC METABOLIC PANEL
GFR calc non Af Amer: >60 mL/min (ref >60)
Glucose, Bld: 114 mg/dL — ABNORMAL HIGH (ref 70–99)
Potassium: 4.6 mEq/L (ref 3.5–5.1)
Sodium: 117 mEq/L — CL (ref 135–145)

## 2011-01-21 LAB — URINALYSIS, ROUTINE W REFLEX MICROSCOPIC
Nitrite: NEGATIVE
Specific Gravity, Urine: 1.009 (ref 1.005–1.030)
pH: 7 (ref 5.0–8.0)

## 2011-01-21 LAB — CBC
HCT: 35.1 % — ABNORMAL LOW (ref 36.0–46.0)
Platelets: 252 10*3/uL (ref 150–400)
RDW: 11.9 % (ref 11.5–15.5)
WBC: 7.8 10*3/uL (ref 4.0–10.5)

## 2011-01-21 LAB — POCT CARDIAC MARKERS
CKMB, poc: 1.2 ng/mL (ref 1.0–8.0)
Troponin i, poc: <0.05 ng/mL (ref 0.00–0.09)

## 2011-01-21 LAB — DIFFERENTIAL
Basophils Absolute: 0 10*3/uL (ref 0.0–0.1)
Eosinophils Relative: 1 % (ref 0–5)
Lymphocytes Relative: 19 % (ref 12–46)

## 2011-01-21 LAB — URINE MICROSCOPIC-ADD ON

## 2011-01-22 ENCOUNTER — Ambulatory Visit: Payer: Self-pay | Admitting: Internal Medicine

## 2011-01-22 LAB — COMPREHENSIVE METABOLIC PANEL
ALT: 15 U/L (ref 0–35)
AST: 25 U/L (ref 0–37)
Albumin: 2.9 g/dL — ABNORMAL LOW (ref 3.5–5.2)
CO2: 27 mEq/L (ref 19–32)
Calcium: 8.5 mg/dL (ref 8.4–10.5)
GFR calc Af Amer: >60 mL/min (ref >60)
GFR calc non Af Amer: >60 mL/min (ref >60)
Sodium: 124 mEq/L — ABNORMAL LOW (ref 135–145)
Total Protein: 5.2 g/dL — ABNORMAL LOW (ref 6.0–8.3)

## 2011-01-22 LAB — DIFFERENTIAL
Basophils Absolute: 0 10*3/uL (ref 0.0–0.1)
Lymphocytes Relative: 28 % (ref 12–46)
Monocytes Absolute: 1 10*3/uL (ref 0.1–1.0)
Monocytes Relative: 15 % — ABNORMAL HIGH (ref 3–12)
Neutro Abs: 3.8 10*3/uL (ref 1.7–7.7)

## 2011-01-22 LAB — OSMOLALITY: Osmolality: 245 mOsm/kg — ABNORMAL LOW (ref 275–300)

## 2011-01-22 LAB — CBC
HCT: 32.1 % — ABNORMAL LOW (ref 36.0–46.0)
Hemoglobin: 11.4 g/dL — ABNORMAL LOW (ref 12.0–15.0)
MCH: 30.6 pg (ref 26.0–34.0)
MCHC: 35.5 g/dL (ref 30.0–36.0)
RBC: 3.72 MIL/uL — ABNORMAL LOW (ref 3.87–5.11)

## 2011-01-22 LAB — HEPATIC FUNCTION PANEL
Bilirubin, Direct: 0.2 mg/dL (ref 0.0–0.3)
Indirect Bilirubin: 0.5 mg/dL (ref 0.3–0.9)

## 2011-01-22 LAB — CARDIAC PANEL(CRET KIN+CKTOT+MB+TROPI)
CK, MB: 3.3 ng/mL (ref 0.3–4.0)
CK, MB: 2.9 ng/mL (ref 0.3–4.0)
Relative Index: INVALID (ref 0.0–2.5)
Relative Index: INVALID (ref 0.0–2.5)
Total CK: 85 U/L (ref 7–177)

## 2011-01-22 LAB — OSMOLALITY, URINE: Osmolality, Ur: 192 mOsm/kg — ABNORMAL LOW (ref 390–1090)

## 2011-01-23 LAB — BASIC METABOLIC PANEL
Calcium: 8.4 mg/dL (ref 8.4–10.5)
GFR calc Af Amer: >60 mL/min (ref >60)
GFR calc non Af Amer: 54 mL/min — ABNORMAL LOW (ref >60)
Potassium: 4.3 mEq/L (ref 3.5–5.1)
Sodium: 127 mEq/L — ABNORMAL LOW (ref 135–145)

## 2011-01-23 NOTE — Assessment & Plan Note (Signed)
Summary: CHEST BURNING  -PER DAHLIA SCHED  STC   Vital Signs:  Patient profile:   77 year old female Height:      59 inches Weight:      147 pounds BMI:     29.80 O2 Sat:      95 % on Room air Temp:     97.6 degrees F oral Pulse rate:   77 / minute BP sitting:   142 / 68  (left arm) Cuff size:   regular  Vitals Entered By: Zella Ball Ewing CMA Duncan Dull) (January 15, 2011 4:06 PM)  O2 Flow:  Room air CC: Chest Burning/RE   CC:  Chest Burning/RE.  History of Present Illness: here to f/u - itching has improved, but now c/o burning anterior chest discofort, admits to some dietary indiscretions with overt reflulx despite the nexium qd ,  no dysphagia but has hoarseness (but also sinus allergies with post nasal gtt);  did vomit once this am but this is very rare;  took TUMS 2 days ago to her clearly helped but does not take on a regular basis;  has chronic hoarsness and has seen Dr Mcarthur Rossetti, and Dr Stasia Cavalier who  diagnosed her with reflux;  has nexium she does take once per day;  no abd pain, bowel change, or blood. Not sure if she vomited her pills this am.   Gets diarrhea once time last wk , but also very infreqeuent, no bloating or regurgitation or aspiration.  Has seen Dr Dyanne Carrel with EGD in the past - yrs ago.  Pt did not keep her most recent GI appt in 2011 as she felt well.  Has had a few lbs wt loss per pt, and some nonprod cough she attrributes to  reflux or allergies as well. No fever,  night sweats, loss of appetite or other constitutional symptoms  Prurutis and rash from last visit much improved (rash actually resolved).  Pt denies other CP, worsening sob, doe, wheezing, orthopnea, pnd, worsening LE edema, palps, dizziness or syncope .  Pt denies new neuro symptoms such as headache, facial or extremity weakness Pt denies polydipsia, polyuria.    Problems Prior to Update: 1)  Chest Pain  (ICD-786.50) 2)  Dermatitis Factitia  (ICD-698.4) 3)  Pruritus  (ICD-698.9) 4)  Colonic  Polyps, Hyperplastic, Hx of  (ICD-V12.72) 5)  Hiatal Hernia  (ICD-553.3) 6)  Uti  (ICD-599.0) 7)  Uti  (ICD-599.0) 8)  Preventive Health Care  (ICD-V70.0) 9)  Fatigue  (ICD-780.79) 10)  Osteoarthritis, Knees, Bilateral  (ICD-715.96) 11)  Unspecified Vitamin D Deficiency  (ICD-268.9) 12)  Contusion, Lower Leg, Right  (ICD-924.10) 13)  Fatigue  (ICD-780.79) 14)  Insomnia-sleep Disorder-unspec  (ICD-780.52) 15)  Skin Rash  (ICD-782.1) 16)  Insomnia-sleep Disorder-unspec  (ICD-307.40) 17)  Allergic Rhinitis  (ICD-477.9) 18)  Anxiety  (ICD-300.00) 19)  Diverticulosis, Colon  (ICD-562.10) 20)  Depression  (ICD-311) 21)  Gerd  (ICD-530.81) 22)  Arthritis, Rheumatoid  (ICD-714.0) 23)  Asthma  (ICD-493.90) 24)  Osteoporosis  (ICD-733.00) 25)  Fatigue  (ICD-780.79) 26)  Hyperlipidemia  (ICD-272.4) 27)  Hypertension  (ICD-401.9)  Medications Prior to Update: 1)  Clotrimazole-Betamethasone 1-0.05 % Crea (Clotrimazole-Betamethasone) .... Use Asd Two Times A Day As Needed 2)  Lisinopril 20 Mg  Tabs (Lisinopril) .Marland Kitchen.. 1 By Mouth Qd 3)  Nabumetone 750 Mg Tabs (Nabumetone) .Marland Kitchen.. 1 By Mouth Two Times A Day Prn 4)  Nexium 40 Mg Cpdr (Esomeprazole Magnesium) .... Take 1 Capsule By Mouth Once A Day 5)  Flovent Hfa 44 Mcg/act  Aero (Fluticasone Propionate  Hfa) .... 2 Puffs Q12 Hrs 6)  Tramadol Hcl 50 Mg Tabs (Tramadol Hcl) .Marland Kitchen.. 1 By Mouth Q 6 Hrs As Needed Pain 7)  Xopenex 1.25 Mg/10ml  Nebu (Levalbuterol Hcl) .... Use Asd Qid Prn 8)  Xyzal 5 Mg  Tabs (Levocetirizine Dihydrochloride) .Marland Kitchen.. 1 By Mouth Qd 9)  Alendronate Sodium 70 Mg Tabs (Alendronate Sodium) .Marland Kitchen.. 1po Q Wk 10)  Miralax  Powd (Polyethylene Glycol 3350) .Marland KitchenMarland KitchenMarland Kitchen 17 Gm I Water By Mouth Once Daily 11)  Vitamin D 1000 Unit Caps (Cholecalciferol) .Marland Kitchen.. 1po Once Daily 12)  Allergy Shots Weekly 13)  Prednisone 10 Mg Tabs (Prednisone) .Marland Kitchen.. 1po Once Daily 14)  Hydroxyzine Hcl 25 Mg Tabs (Hydroxyzine Hcl) .Marland Kitchen.. 1 By Mouth Q 6 Hrs As Needed Itching 15)   Triamcinolone Acetonide 0.1 % Crea (Triamcinolone Acetonide) .... Use Asd Two Times A Day To Affected Area 16)  Sertraline Hcl 50 Mg Tabs (Sertraline Hcl) .Marland Kitchen.. 1po Once Daily  Current Medications (verified): 1)  Clotrimazole-Betamethasone 1-0.05 % Crea (Clotrimazole-Betamethasone) .... Use Asd Two Times A Day As Needed 2)  Lisinopril 20 Mg  Tabs (Lisinopril) .Marland Kitchen.. 1 By Mouth Qd 3)  Nabumetone 750 Mg Tabs (Nabumetone) .Marland Kitchen.. 1 By Mouth Two Times A Day Prn 4)  Nexium 40 Mg Cpdr (Esomeprazole Magnesium) .... Take 1 Capsule By Mouth Two Times A Day 5)  Flovent Hfa 44 Mcg/act  Aero (Fluticasone Propionate  Hfa) .... 2 Puffs Q12 Hrs 6)  Tramadol Hcl 50 Mg Tabs (Tramadol Hcl) .Marland Kitchen.. 1 By Mouth Q 6 Hrs As Needed Pain 7)  Xopenex 1.25 Mg/22ml  Nebu (Levalbuterol Hcl) .... Use Asd Qid Prn 8)  Xyzal 5 Mg  Tabs (Levocetirizine Dihydrochloride) .Marland Kitchen.. 1 By Mouth Qd 9)  Alendronate Sodium 70 Mg Tabs (Alendronate Sodium) .Marland Kitchen.. 1po Q Wk 10)  Miralax  Powd (Polyethylene Glycol 3350) .Marland KitchenMarland KitchenMarland Kitchen 17 Gm I Water By Mouth Once Daily 11)  Vitamin D 1000 Unit Caps (Cholecalciferol) .Marland Kitchen.. 1po Once Daily 12)  Allergy Shots Weekly 13)  Prednisone 10 Mg Tabs (Prednisone) .Marland Kitchen.. 1po Once Daily 14)  Hydroxyzine Hcl 25 Mg Tabs (Hydroxyzine Hcl) .Marland Kitchen.. 1 By Mouth Q 6 Hrs As Needed Itching 15)  Triamcinolone Acetonide 0.1 % Crea (Triamcinolone Acetonide) .... Use Asd Two Times A Day To Affected Area 16)  Sertraline Hcl 50 Mg Tabs (Sertraline Hcl) .Marland Kitchen.. 1po Once Daily  Allergies (verified): 1)  ! Celexa 2)  ! Remeron 3)  ! 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   Head:  normocephalic and atraumatic.   Eyes:  vision grossly intact, pupils equal, and pupils round.   Ears:  R ear normal and L ear normal.   Nose:  no external deformity and no nasal discharge.   Mouth:  no gingival abnormalities and pharynx pink and moist.   Neck:  supple and no masses.   Lungs:  normal respiratory effort, R decreased breath sounds, and L decreased breath sounds. but no wheezing or rales  Heart:  normal rate and regular rhythm.   Abdomen:  soft, non-tender, and normal bowel sounds.   Msk:  no joint tenderness and no joint swelling.   Extremities:  no edema, no erythema  Skin:  color normal and no rashes.   Psych:  not depressed appearing and moderately anxious.     Impression & Recommendations:  Problem # 1:  CHEST PAIN (ICD-786.50) atypical, seems likely reflux related, to increase the PPI to two times a day, check CXR,  consider GI referral if not improved in 1 wk; ECG today reviewed with pt - sinus rhythm, no acute change Orders: EKG w/ Interpretation (93000) T-2 View CXR, Same Day (71020.5TC)  Problem # 2:  DERMATITIS FACTITIA (ICD-698.4) resolved - Continue all previous medications as before this visit   Problem # 3:  PRURITUS (ICD-698.9) much improved overall - Continue all previous medications as before this visit   Problem # 4:  GERD (ICD-530.81)  Her updated medication list for this problem includes:    Nexium 40 Mg Cpdr (Esomeprazole magnesium) .Marland Kitchen... Take 1 capsule by mouth two times a  day treat as above, f/u any worsening signs or symptoms   EGD: Location: [facility]   (02/16/2003)  Labs Reviewed: Hgb: 13.8 (01/10/2011)   Hct: 40.0 (01/10/2011)  Complete Medication List: 1)  Clotrimazole-betamethasone 1-0.05 % Crea (Clotrimazole-betamethasone) .... Use asd two times a day as needed 2)  Lisinopril 20 Mg Tabs (Lisinopril) .Marland Kitchen.. 1 by mouth qd 3)  Nabumetone 750 Mg Tabs (Nabumetone) .Marland Kitchen.. 1 by mouth two times a day prn 4)  Nexium 40 Mg Cpdr (Esomeprazole magnesium) .... Take 1 capsule by mouth two times a day 5)  Flovent Hfa 44 Mcg/act Aero (Fluticasone propionate  hfa) .... 2 puffs q12 hrs 6)  Tramadol Hcl 50 Mg Tabs (Tramadol hcl) .Marland Kitchen.. 1 by mouth q 6 hrs as needed pain 7)  Xopenex 1.25 Mg/97ml Nebu (Levalbuterol hcl) .... Use asd qid prn 8)  Xyzal 5 Mg Tabs (Levocetirizine dihydrochloride) .Marland Kitchen.. 1 by mouth qd 9)  Alendronate Sodium 70 Mg Tabs (Alendronate sodium) .Marland Kitchen.. 1po q wk 10)  Miralax Powd (Polyethylene glycol 3350) .Marland KitchenMarland Kitchen. 17 gm i water by mouth once daily 11)  Vitamin D 1000 Unit Caps (Cholecalciferol) .Marland Kitchen.. 1po once daily 12)  Allergy Shots Weekly  13)  Prednisone 10 Mg Tabs (Prednisone) .Marland Kitchen.. 1po once daily 14)  Hydroxyzine Hcl 25 Mg Tabs (Hydroxyzine hcl) .Marland Kitchen.. 1 by mouth q 6 hrs as needed itching 15)  Triamcinolone Acetonide 0.1 % Crea (Triamcinolone acetonide) .... Use asd two times a day to affected area 16)  Sertraline Hcl 50 Mg Tabs (Sertraline hcl) .Marland Kitchen.. 1po once daily  Patient Instructions: 1)  Your EKG was good today 2)  Please go to Radiology in the basement level for your X-Ray today  3)  Please call the number on the [facility] Card for results of your testing  4)  increase the nexium to two times a day  (the [facility]) 5)  If not better, please call in 1 wk for referral to Dr Juanda Chance 6)  Please schedule a follow-up appointment as needed. Prescriptions: NEXIUM 40 MG CPDR (ESOMEPRAZOLE MAGNESIUM) Take 1 capsule by mouth two  times a day  #60 x 11  Entered and Authorized by:   Corwin Levins MD   Signed by:   Corwin Levins MD on 01/15/2011   Method used:   Electronically to        [facility] [phone]* (retail)       [address]       [place], [place]  [address]       Ph: [phone] or [phone]       Fax: [phone]   RxID:   [id number]    Orders Added: 1)  EKG w/ Interpretation [93000] 2)  T-2 View CXR, Same Day [71020.5TC] 3)  Est. Patient Level IV [36644]

## 2011-01-23 NOTE — Progress Notes (Signed)
Summary: REQ CALL   Phone Note Call from Patient Call back at Home Phone [phone]   Summary of Call: Pt left vm this am req a call back regarding vomiting & diarrhea.  Initial call taken by: Lamar Sprinkles, CMA,  January 16, 2011 9:54 AM  Follow-up for Phone Call        Pt called stating she has vomited once this morning and has diarrhea twice so far. Pt is requesting Rx for diarrhea and vomiting, Follow-up by: Margaret Pyle, CMA,  January 16, 2011 10:07 AM  Additional Follow-up for Phone Call Additional follow up Details #1::        ok for phenergan as needed, (done per emr)  ok for OTC immodium as needed  Additional Follow-up by: Corwin Levins MD,  January 16, 2011 12:45 PM    Additional Follow-up for Phone Call Additional follow up Details #2::    pt advised of MD recommendation and Rx Follow-up by: Margaret Pyle, CMA,  January 16, 2011 1:40 PM  New/Updated Medications: PROMETHAZINE HCL 25 MG TABS (PROMETHAZINE HCL) 1po q 6 hrs as needed nausea Prescriptions: PROMETHAZINE HCL 25 MG TABS (PROMETHAZINE HCL) 1po q 6 hrs as needed nausea  #40 x 1   Entered and Authorized by:   Corwin Levins MD   Signed by:   Corwin Levins MD on 01/16/2011   Method used:   [facility] to        [facility] [phone]* (retail)       [address]       [place], [place]  [address]       Ph: [phone] or [phone]       Fax: [phone]   RxID:   [id number]

## 2011-01-23 NOTE — Progress Notes (Signed)
Summary: results  Phone Note Call from Patient Call back at Home Phone [phone]   Caller: Patient Summary of Call: Pt requesting results of chest Xray. Initial call taken by: Burnard Leigh [facility]),  January 19, 2011 11:41 AM  Follow-up for Phone Call        [facility] that Pt's results were left on phone tree per Dr Jonny Ruiz. Informed Pt of results on ans machine (per HIPAA) Follow-up by: Burnard Leigh [facility]),  January 19, 2011 11:43 AM

## 2011-01-23 NOTE — Assessment & Plan Note (Signed)
Summary: ITCHING / SHINGLES? /NWS   Vital Signs:  Patient profile:   77 year old female Height:      59 inches Weight:      147.25 pounds BMI:     29.85 O2 Sat:      98 % on Room air Temp:     97.9 degrees F oral Pulse rate:   68 / minute BP sitting:   132 / 58  (left arm) Cuff size:   regular  Vitals Entered By: Zella Ball Ewing CMA (AAMA) (January 10, 2011 9:05 AM)  O2 Flow:  Room air  CC: Rash under arms, scalp and waist, itching and burning/RE   CC:  Rash under arms, scalp and waist, and itching and burning/RE.  History of Present Illness: here to f/u;  overall ok but quite fixated and complaining today of diffuse dry skin that is quite distressing;  has not been using moisterurizer but has been worse in the past 2 wks, so much pruritic (without swelling or rash) that she has scratched with signficiant erythem areas now to the left lateral deltoid area and the right bicep area, both now somewhat painful as well;  Denies worsening depressive symptoms, suicidal ideation, or panic, but has signficant anxiety and willing to try tx such as SSRI,  delicnes need for counseling or other tx at this time more than she has.  Otherwise c/o ongoing fagigue somwhat worse recetnly, but cant be more specific,  Pt denies CP, worsening sob, doe, wheezing, orthopnea, pnd, worsening LE edema, palps, dizziness or syncope  Pt denies new neuro symptoms such as headache, facial or extremity weakness  Pt denies polydipsia, polyuria  Overall good compliance with meds, trying to follow low chol diet, wt stable, little excercise however . No fever, wt loss, night sweats, loss of appetite or other constitutional symptoms  Overall good compliance with meds, and good tolerability.  does also have ongoing drippy nose adn nasal allergy symtpoms, with occasional post nasal gtt adn cough, worse to go outside which she does not do often.   No tongue sweling, rash or wheezing.    Problems Prior to Update: 1)  Dermatitis  Factitia  (ICD-698.4) 2)  Pruritus  (ICD-698.9) 3)  Colonic Polyps, Hyperplastic, Hx of  (ICD-V12.72) 4)  Hiatal Hernia  (ICD-553.3) 5)  Uti  (ICD-599.0) 6)  Uti  (ICD-599.0) 7)  Preventive Health Care  (ICD-V70.0) 8)  Fatigue  (ICD-780.79) 9)  Osteoarthritis, Knees, Bilateral  (ICD-715.96) 10)  Unspecified Vitamin D Deficiency  (ICD-268.9) 11)  Contusion, Lower Leg, Right  (ICD-924.10) 12)  Fatigue  (ICD-780.79) 13)  Insomnia-sleep Disorder-unspec  (ICD-780.52) 14)  Skin Rash  (ICD-782.1) 15)  Insomnia-sleep Disorder-unspec  (ICD-307.40) 16)  Allergic Rhinitis  (ICD-477.9) 17)  Anxiety  (ICD-300.00) 18)  Diverticulosis, Colon  (ICD-562.10) 19)  Depression  (ICD-311) 20)  Gerd  (ICD-530.81) 21)  Arthritis, Rheumatoid  (ICD-714.0) 22)  Asthma  (ICD-493.90) 23)  Osteoporosis  (ICD-733.00) 24)  Fatigue  (ICD-780.79) 25)  Hyperlipidemia  (ICD-272.4) 26)  Hypertension  (ICD-401.9)  Medications Prior to Update: 1)  Clotrimazole-Betamethasone 1-0.05 % Crea (Clotrimazole-Betamethasone) .... Use Asd Two Times A Day As Needed 2)  Lisinopril 20 Mg  Tabs (Lisinopril) .Marland Kitchen.. 1 By Mouth Qd 3)  Nabumetone 750 Mg Tabs (Nabumetone) .Marland Kitchen.. 1 By Mouth Two Times A Day Prn 4)  Nexium 40 Mg Cpdr (Esomeprazole Magnesium) .... Take 1 Capsule By Mouth Once A Day 5)  Flovent Hfa 44 Mcg/act  Aero (Fluticasone Propionate  Hfa) .Marland KitchenMarland KitchenMarland Kitchen  2 Puffs Q12 Hrs 6)  Tramadol Hcl 50 Mg Tabs (Tramadol Hcl) .Marland Kitchen.. 1 By Mouth Q 6 Hrs As Needed Pain 7)  Xopenex 1.25 Mg/26ml  Nebu (Levalbuterol Hcl) .... Use Asd Qid Prn 8)  Xyzal 5 Mg  Tabs (Levocetirizine Dihydrochloride) .Marland Kitchen.. 1 By Mouth Qd 9)  Alendronate Sodium 70 Mg Tabs (Alendronate Sodium) .Marland Kitchen.. 1po Q Wk 10)  Miralax  Powd (Polyethylene Glycol 3350) .Marland KitchenMarland KitchenMarland Kitchen 17 Gm I Water By Mouth Once Daily 11)  Vitamin D 1000 Unit Caps (Cholecalciferol) .Marland Kitchen.. 1po Once Daily 12)  Allergy Shots Weekly 13)  Ciprofloxacin Hcl 500 Mg Tabs (Ciprofloxacin Hcl) .Marland Kitchen.. 1 By Mouth Two Times A Day  Current  Medications (verified): 1)  Clotrimazole-Betamethasone 1-0.05 % Crea (Clotrimazole-Betamethasone) .... Use Asd Two Times A Day As Needed 2)  Lisinopril 20 Mg  Tabs (Lisinopril) .Marland Kitchen.. 1 By Mouth Qd 3)  Nabumetone 750 Mg Tabs (Nabumetone) .Marland Kitchen.. 1 By Mouth Two Times A Day Prn 4)  Nexium 40 Mg Cpdr (Esomeprazole Magnesium) .... Take 1 Capsule By Mouth Once A Day 5)  Flovent Hfa 44 Mcg/act  Aero (Fluticasone Propionate  Hfa) .... 2 Puffs Q12 Hrs 6)  Tramadol Hcl 50 Mg Tabs (Tramadol Hcl) .Marland Kitchen.. 1 By Mouth Q 6 Hrs As Needed Pain 7)  Xopenex 1.25 Mg/51ml  Nebu (Levalbuterol Hcl) .... Use Asd Qid Prn 8)  Xyzal 5 Mg  Tabs (Levocetirizine Dihydrochloride) .Marland Kitchen.. 1 By Mouth Qd 9)  Alendronate Sodium 70 Mg Tabs (Alendronate Sodium) .Marland Kitchen.. 1po Q Wk 10)  Miralax  Powd (Polyethylene Glycol 3350) .Marland KitchenMarland KitchenMarland Kitchen 17 Gm I Water By Mouth Once Daily 11)  Vitamin D 1000 Unit Caps (Cholecalciferol) .Marland Kitchen.. 1po Once Daily 12)  Allergy Shots Weekly 13)  Prednisone 10 Mg Tabs (Prednisone) .Marland Kitchen.. 1po Once Daily 14)  Hydroxyzine Hcl 25 Mg Tabs (Hydroxyzine Hcl) .Marland Kitchen.. 1 By Mouth Q 6 Hrs As Needed Itching 15)  Triamcinolone Acetonide 0.1 % Crea (Triamcinolone Acetonide) .... Use Asd Two Times A Day To Affected Area 16)  Sertraline Hcl 50 Mg Tabs (Sertraline Hcl) .Marland Kitchen.. 1po Once Daily  Allergies (verified): 1)  ! Celexa 2)  ! Remeron 3)  ! 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   Head:  normocephalic and atraumatic.   Eyes:  vision grossly intact, pupils equal, and pupils round.   Ears:  R ear normal and L ear normal.   Nose:  no external deformity and no nasal discharge.   Mouth:  no gingival abnormalities and pharynx pink and moist.   Neck:  supple and no masses.   Lungs:  normal respiratory effort, R decreased breath sounds, and L decreased breath sounds. but no wheezing or rales  Heart:  normal rate and regular rhythm.   Abdomen:  soft, non-tender, and normal bowel sounds.  Msk:  no flank tender Extremities:  no edema, no erythema  Neurologic:  no confusion, motor gross normal Skin:  color normal and no rashes.  except for diffuse dry skin to the torso and upper extremities, with excoriations to the left deltoid area and the right bicep area Psych:  not depressed appearing and moderately anxious.     Impression & Recommendations:  Problem # 1:  PRURITUS (ICD-698.9)  liekly due to dry skin - to cont moisturizer daily, and gave atarax as needed itch  Orders: TLB-Sedimentation Rate (ESR) (85652-ESR)  Problem # 2:  DERMATITIS FACTITIA (ICD-698.4) due to excoriations due to above; ok to follow - treat as above, f/u any worsening signs or symptoms   Problem # 3:  ALLERGIC RHINITIS (ICD-477.9)  Her updated medication list for this problem includes:    Xyzal 5 Mg Tabs (Levocetirizine dihydrochloride) .Marland Kitchen... 1 by mouth qd ok for limited small course prednisone asd   Problem # 4:  ANXIETY (ICD-300.00)  to start the zoloft low dose 50 mg per day  Her updated medication list for this problem includes:    Hydroxyzine Hcl 25 Mg Tabs (Hydroxyzine hcl) .Marland Kitchen... 1 by mouth q 6 hrs as needed itching     Sertraline Hcl 50 Mg Tabs (Sertraline hcl) .Marland Kitchen... 1po once daily  Problem # 5:  FATIGUE (ICD-780.79)  exam benign, to check labs below; follow with expectant management   Orders: TLB-BMP (Basic Metabolic Panel-BMET) (80048-METABOL) TLB-CBC Platelet - w/Differential (85025-CBCD) TLB-Hepatic/Liver Function Pnl (80076-HEPATIC) TLB-TSH (Thyroid Stimulating Hormone) (84443-TSH)  Problem # 6:  HYPERLIPIDEMIA (ICD-272.4)  Labs Reviewed: SGOT: 18 (01/18/2010)   SGPT: 13 (01/18/2010)   HDL:68.20 (01/18/2010), 49.50 (01/19/2009)  LDL:87 (01/18/2010), 110 (04/54/0981)  Chol:182 (01/18/2010), 185 (01/19/2009)  Trig:136.0 (01/18/2010), 130.0 (01/19/2009) stable overall by hx and exam, ok to continue meds/tx as is . Pt to continue diet efforts, good med tolerance; to check labs - goal LDL less than 70   Orders: TLB-Lipid Panel (80061-LIPID)  Complete Medication List: 1)  Clotrimazole-betamethasone 1-0.05 % Crea (Clotrimazole-betamethasone) .... Use asd two times a day as needed 2)  Lisinopril 20 Mg Tabs (Lisinopril) .Marland Kitchen.. 1 by mouth qd 3)  Nabumetone 750 Mg Tabs (Nabumetone) .Marland Kitchen.. 1 by mouth two times a day prn 4)  Nexium 40 Mg Cpdr (Esomeprazole magnesium) .... Take 1 capsule by mouth once a day 5)  Flovent Hfa 44 Mcg/act Aero (Fluticasone propionate  hfa) .... 2 puffs q12 hrs 6)  Tramadol Hcl 50 Mg Tabs (Tramadol hcl) .Marland Kitchen.. 1 by mouth q 6 hrs as needed pain 7)  Xopenex 1.25 Mg/42ml Nebu (Levalbuterol hcl) .... Use asd qid prn 8)  Xyzal 5 Mg Tabs (Levocetirizine dihydrochloride) .Marland Kitchen.. 1 by mouth qd 9)  Alendronate Sodium 70 Mg Tabs (Alendronate sodium) .Marland Kitchen.. 1po q wk 10)  Miralax Powd (Polyethylene glycol 3350) .Marland KitchenMarland Kitchen. 17 gm i water by mouth once daily 11)  Vitamin D 1000 Unit Caps (Cholecalciferol) .Marland Kitchen.. 1po once daily 12)  Allergy Shots Weekly  13)  Prednisone 10 Mg Tabs (Prednisone) .Marland Kitchen.. 1po once daily 14)  Hydroxyzine Hcl 25 Mg Tabs (Hydroxyzine hcl) .Marland Kitchen.. 1 by mouth q 6 hrs as needed itching 15)   Triamcinolone Acetonide 0.1 % Crea (Triamcinolone acetonide) .... Use asd two times a day to affected area 16)  Sertraline Hcl 50 Mg Tabs (Sertraline hcl) .Marland Kitchen.. 1po once daily  Patient Instructions: 1)  Please take all new medications as prescribed - the prednisone, streoid cream, itching medication, and the low dose generic zoloft (  sertraline) 2)  Continue all previous medications as before this visit  3)  Please continue your moisturizer use as the dry skin is most likely the maiin problem 4)  Please go to the Lab in the basement for your blood and/or urine tests today 5)  Please call the number on the [facility] Card for results of your testing  6)  Please schedule a follow-up appointment in 6 months, or sooner if needed Prescriptions: SERTRALINE HCL 50 MG TABS (SERTRALINE HCL) 1po once daily  #30 x 11   Entered and Authorized by:   Corwin Levins MD   Signed by:   Corwin Levins MD on 01/10/2011   Method used:   Print then Give to Patient   RxID:   [id number] TRIAMCINOLONE ACETONIDE 0.1 % CREA (TRIAMCINOLONE ACETONIDE) use asd two times a day to affected area  #1 x 1   Entered and Authorized by:   Corwin Levins MD   Signed by:   Corwin Levins MD on 01/10/2011   Method used:   Print then Give to Patient   RxID:   [id number] HYDROXYZINE HCL 25 MG TABS (HYDROXYZINE HCL) 1 by mouth q 6 hrs as needed itching  #60 x 2   Entered and Authorized by:   Corwin Levins MD   Signed by:   Corwin Levins MD on 01/10/2011   Method used:   Print then Give to Patient   RxID:   [id number] PREDNISONE 10 MG TABS (PREDNISONE) 1po once daily  #7 x 0   Entered and Authorized by:   Corwin Levins MD   Signed by:   Corwin Levins MD on 01/10/2011   Method used:   Print then Give to Patient   RxID:   [id number]    Orders Added: 1)  TLB-BMP (Basic Metabolic Panel-BMET) [80048-METABOL] 2)  TLB-CBC Platelet - w/Differential [85025-CBCD] 3)  TLB-Hepatic/Liver Function Pnl [80076-HEPATIC] 4)  TLB-TSH  (Thyroid Stimulating Hormone) [84443-TSH] 5)  TLB-Sedimentation Rate (ESR) [85652-ESR] 6)  TLB-Lipid Panel [80061-LIPID] 7)  Est. Patient Level IV [29518]

## 2011-01-23 NOTE — Progress Notes (Signed)
Summary: Nausea - req a call   Phone Note Call from Patient Call back at Home Phone [phone]   Summary of Call: Patient is requesting a call back regarding nausea.  Initial call taken by: Lamar Sprinkles, CMA,  January 15, 2011 9:41 AM  Follow-up for Phone Call        Pt has been scheduled with JWJ at 4:15p Follow-up by: Margaret Pyle, CMA,  January 15, 2011 9:45 AM

## 2011-01-24 LAB — BASIC METABOLIC PANEL
BUN: 9 mg/dL (ref 6–23)
Chloride: 103 mEq/L (ref 96–112)
GFR calc non Af Amer: 59 mL/min — ABNORMAL LOW (ref >60)
Glucose, Bld: 94 mg/dL (ref 70–99)
Potassium: 4.1 mEq/L (ref 3.5–5.1)
Sodium: 131 mEq/L — ABNORMAL LOW (ref 135–145)

## 2011-01-25 LAB — URINE CULTURE: Culture  Setup Time: 201203190035

## 2011-01-25 LAB — BASIC METABOLIC PANEL
BUN: 9 mg/dL (ref 6–23)
Chloride: 99 mEq/L (ref 96–112)
GFR calc non Af Amer: >60 mL/min (ref >60)
Glucose, Bld: 107 mg/dL — ABNORMAL HIGH (ref 70–99)
Potassium: 4.1 mEq/L (ref 3.5–5.1)
Sodium: 130 mEq/L — ABNORMAL LOW (ref 135–145)

## 2011-01-25 NOTE — Discharge Summary (Signed)
Robin Arroyo, Robin Arroyo            ACCOUNT NO.:  [id number]  MEDICAL RECORD NO.:  [id number]           PATIENT TYPE:  I  LOCATION:  5152                         FACILITY:  [facility]  PHYSICIAN:  Clydia Llano, MD       DATE OF BIRTH:  06-Aug-1919  DATE OF ADMISSION:  01/21/2011 DATE OF DISCHARGE:                        DISCHARGE SUMMARY - REFERRING   PRIMARY CARE PHYSICIAN:  Corwin Levins, MD  REASON FOR ADMISSION:  "Burning in my chest."  DISCHARGE DIAGNOSES: 1. Chest pain secondary to gastroesophageal reflux disease. 2. Hyponatremia. 3. Enterococcus urinary tract infection. 4. Hypertension. 5. Hyperlipidemia. 6. Osteoporosis. 7. Asthma. 8. Rheumatoid arthritis. 9. Hearing loss. 10.Diverticulosis. 11.Depression/anxiety. 12.Allergic rhinitis. 13.History of vitamin D deficiency. 14.Solitary functional kidney.  DISCHARGE MEDICATIONS: 1. Ampicillin 250 mg every 6 hours for 5 more days. 2. Calcium. 3. Tums 200 mg 1 tablet 3 times a day as needed. 4. Clotrimazole betamethasone 0.05% cream applied topically twice     daily as needed for vaginal itching. 5. Flovent HFA 45 mcg, 2 puffs inhaled every 12 hours. 6. Xyzal 5 mg p.o. at bedtime. 7. Lisinopril 20 mg p.o. daily. 8. MiraLax 17 grams p.o. daily. 9. Nabumetone 750 mg p.o. b.i.d. as needed for pain. 10.Nexium 40 mg p.o. daily. 11.Promethazine 25 mg every 6 hours as needed for nausea. 12.Tramadol 50 mg every 6 hours as needed for pain. 13.Vitamin D 1000 unit over-the-counter 1 capsule p.o. daily. 14.Xopenex 1 nebulization 4 times daily as needed for shortness of     breath.  RADIOLOGY:  Chest x-ray March 12 showed no acute disease.  BRIEF HISTORY AND EXAMINATION:  Robin Arroyo is a 77 year old female with history of gastroesophageal reflux disease, complaining about burning in her chest intermittently.  The patient is known to have some nausea, vomiting, and diarrhea.  She apparently came into Dr. Jonny Ruiz in Urgent Care and  given some medication.  The patient noted this has helped some but she was having intermittent burning in her chest and presents to the emergency department for evaluation.  The patient eventually was treated with some Zofran and EMS said this has resolved her symptoms.  The patient in the emergency department, routine labs showed that she had sodium 117.  BRIEF HOSPITAL COURSE: 1. Atypical chest pain.  The patient admitted to the hospital with     acute coronary syndrome ruled out by serial cardiac enzymes and     EKG.  It showed no evidence of ischemia, probably the chest pain     which is burning in nature secondary to gastroesophageal reflux     disease. 2. UTI.  I suspect this is ground zero for everything.  The patient     did have enterococcus UTI which was treated as outpatient with     Flagyl and doxycycline.  When she came in here, the patient has     like 3 days of ciprofloxacin and enterococcus growth.  At time of     discharge, the patient was put on ampicillin for 5 days. 3. Hyponatremia.  It is probably secondary to nausea, vomiting, and     diarrhea from the nausea/vomiting, plus  or minus diarrhea the     patient had from the UTI. 4. Hyponatremia is probably secondary to the dehydration.  The patient     was treated with IV fluids.  Sodium when she came in was 117 and it     has went back to 131, today is 130.  The patient needs double check     BMP in 3-4 days to ensure resolution. 5. Gastroesophageal reflux disease probably exacerbated by the     doxycycline.  The patient had history of gastroesophageal reflux     disease and treated recently with doxycycline for UTI.  Doxycycline     one of the medication has very low pH and very high acidity which     might have exacerbated gastroesophageal reflux disease.  That was     discontinued at the time of admission.  The patient was treated     also with as needed Tums for the burning and that was responded. 6.  Hypertension.  The patient medication's was not changed while she     was in the hospital.  The patient was on lisinopril. 7. Rheumatoid arthritis.  The patient is taking pain medication for     that tramadol as well as nabumetone.  That was continued throughout     the hospital stay.  No changes were done.  DISCHARGE INSTRUCTIONS: 1. Diet:  Heart-healthy diet. 2. Activity:  As tolerated. 3. Disposition:  Skilled nursing facility.     Clydia Llano, MD     ME/MEDQ  D:  01/25/2011  T:  01/25/2011  Job:  [id number]  cc:   Corwin Levins, MD  Electronically Signed by Clydia Llano  on 01/25/2011 08:16:08 PM

## 2011-02-15 NOTE — H&P (Signed)
Robin Arroyo, Robin Arroyo NO.:  [id number]  MEDICAL RECORD NO.:  [id number]           PATIENT TYPE:  I  LOCATION:  3702                         FACILITY:  [facility]  PHYSICIAN:  Massie Maroon, MD        DATE OF BIRTH:  [birth year]  DATE OF ADMISSION:  01/21/2011 DATE OF DISCHARGE:                             HISTORY & PHYSICAL   CHIEF COMPLAINT:  "Burning in my chest."  HISTORY OF PRESENT ILLNESS:  An 77 year old female with a history of GERD, apparently complains of burning in her chest intermittently.  She has also noted some nausea, vomiting, diarrhea.  She was apparently seen by Dr. Jonny Ruiz in Urgent Care and given some medication.  She notes that this helped some, but tonight she continued to have this intermittent burning in her chest and so presented to the ED for evaluation.  The patient actually was treated with some Zofran en route by EMS and this resolved her symptoms.  However, when she came to the ER, routine lab work was done and she was found to be hyponatremic with sodium of 117. The patient will be admitted for hyponatremia.  PAST MEDICAL HISTORY: 1. Hypertension. 2. Hyperlipidemia. 3. Osteoporosis. 4. Asthma. 5. Rheumatoid arthritis. 6. Hearing loss. 7. GERD. 8. Diverticulosis of the colon. 9. Depression. 10.Anxiety. 11.Allergic rhinitis. 12.Lumbar spine degenerative disk disease. 13.Solitary functional kidney. 14.History of colonic polyps. 15.Vitamin D deficiency.  PAST SURGICAL HISTORY: 1. Hysterectomy. 2. Cholecystectomy. 3. Status post bilateral knee replacements for osteoarthritis. 4. Status post bladder surgery. 5. Appendectomy. 6. Tonsillectomy.  SOCIAL HISTORY:  The patient has never smoked.  She is widowed.  She is a retired Location manager.  She lives with her son.  She does not do any alcohol or drugs.  FAMILY HISTORY:  Mother and sister had breast cancer.  There is a history of bladder cancer.  Her father and brother have  prostate cancer and she has a sister with diabetes type 2.  ALLERGIES: 1. CELEXA. 2. REMERON. 3. BONIVA. 4. FOSAMAX.  MEDICATIONS: 1. Lisinopril 20 mg p.o. daily. 2. Nabumetone 750 mg p.o. b.i.d. p.r.n. 3. Nexium 40 mg p.o. b.i.d. 4. Flovent 44 mcg two puffs q.12 h. 5. Tramadol 50 mg p.o. q.6 h. p.r.n. pain. 6. Xopenex 1.25 mg/3 mL one neb q.i.d. p.r.n. 7. Xyzal 5 mg p.o. daily. 8. Fosamax 70 mg p.o. q. week. 9. MiraLax 17 g p.o. daily. 10.Vitamin D 1000 international units p.o. daily. 11.Allergy shots weekly. 12.Prednisone 10 mg p.o. daily. 13.Hydroxyzine 25 mg p.o. q.6 h. p.r.n. itch. 14.Triamcinolone acetonide 0.1% cream topically b.i.d. p.r.n. 15.Zoloft 50 mg p.o. daily. 16.Clotrimazole and betamethasone cream topically b.i.d. p.r.n. 17.Metronidazole 500 mg p.o. t.i.d. 18.Tetracycline 500 mg p.o. q.i.d.  REVIEW OF SYSTEMS:  Negative for fever, chills, cough, palpitations, shortness of breath, right red blood per rectum or black stool. Negative for all systems except for pertinent positives as stated above.  PHYSICAL EXAMINATION:  VITAL SIGNS:  Temperature 98.3, pulse 75, blood pressure 184/73, pulse ox 97% on room air. HEENT:  Anicteric. NECK:  No JVD.  No bruit. HEART:  Regular rate and rhythm.  S1-S2.  No murmurs, gallops or rubs. LUNGS:  Clear to auscultation bilaterally. ABDOMEN:  Soft, nontender, nondistended.  Positive bowel sounds. EXTREMITIES:  No cyanosis, clubbing or edema. SKIN:  No rashes. LYMPH NODES:  No adenopathy. NEUROLOGIC:  Nonfocal.  Cranial nerves II through XII intact.  Reflexes 2+, symmetric, diffuse with downgoing toes bilaterally, motor strength 5/5 in all four extremities, pinprick intact.  LABORATORY STUDIES:  Troponin-I less than 0.05.  WBC 7.8, hemoglobin 12.6, platelet count 252.  Sodium 113, potassium 4.6, chloride 83, BUN 13, creatinine 0.71.  Urinalysis; wbc's 3-6, rbc's 3-6.  AST 26, ALT 16, alk phos 35, total bilirubin 0.7,  urine sodium 52.  Chest x-ray negative for any acute process.  EKG; normal sinus rhythm at 75, normal axis, T- wave inversion in V1-V2 and aVF, slight ST segment in V4-V6.  ASSESSMENT AND PLAN: 1. Burning in the chest:  Atypical chest pain:  The patient will be     observed.  We will check serial cardiac markers.  Consider     outpatient stress test if none has been performed previously or     inpatient. 2. Hyponatremia:  Likely secondary to diarrhea.  We are waiting workup     with cortisol, TSH, serum osmolality, urine osmolality and urine     sodium. 3. Diarrhea:  Check stool for fecal leukocytes, culture Clostridium     difficile, PCR and continued with metronidazole for now. 4. Nausea and vomiting.  Treat symptomatically with Zofran and     Phenergan.  Continue Nexium. 5. Deep venous thrombosis prophylaxis.  SCDs.     Massie Maroon, MD     JYK/MEDQ  D:  01/22/2011  T:  01/22/2011  Job:  [id number]  cc:   Corwin Levins, MD  Electronically Signed by Pearson Grippe MD on 02/15/2011 01:49:47 AM

## 2011-02-22 ENCOUNTER — Ambulatory Visit (INDEPENDENT_AMBULATORY_CARE_PROVIDER_SITE_OTHER): Payer: Medicare Other | Admitting: Internal Medicine

## 2011-02-22 ENCOUNTER — Encounter: Payer: Self-pay | Admitting: Internal Medicine

## 2011-02-22 VITALS — BP 118/72 | HR 76 | Temp 97.8°F | Ht <= 58 in | Wt 148.0 lb

## 2011-02-22 DIAGNOSIS — N390 Urinary tract infection, site not specified: Secondary | ICD-10-CM

## 2011-02-22 DIAGNOSIS — I10 Essential (primary) hypertension: Secondary | ICD-10-CM

## 2011-02-22 DIAGNOSIS — M069 Rheumatoid arthritis, unspecified: Secondary | ICD-10-CM

## 2011-02-22 DIAGNOSIS — K219 Gastro-esophageal reflux disease without esophagitis: Secondary | ICD-10-CM

## 2011-02-22 DIAGNOSIS — E871 Hypo-osmolality and hyponatremia: Secondary | ICD-10-CM

## 2011-02-22 DIAGNOSIS — F411 Generalized anxiety disorder: Secondary | ICD-10-CM

## 2011-02-22 MED ORDER — CLOTRIMAZOLE-BETAMETHASONE 1-0.05 % EX CREA: TOPICAL_CREAM | Freq: Two times a day (BID) | CUTANEOUS | Status: DC

## 2011-02-22 MED ORDER — SERTRALINE HCL 50 MG PO TABS: 50.0000 mg | ORAL_TABLET | Freq: Every day | ORAL | Status: DC

## 2011-02-22 NOTE — Assessment & Plan Note (Signed)
stable overall by hx and exam, most recent lab reviewed with pt, and pt to continue medical treatment as before  Lab Results  Component Value Date   WBC 6.9 01/22/2011   HGB 11.4* 01/22/2011   HCT 32.1* 01/22/2011   PLT 225 01/22/2011   CHOL 164 01/10/2011   TRIG 111.0 01/10/2011   HDL 51.90 01/10/2011   LDLDIRECT 114.6 12/11/2007   ALT 15 01/22/2011   AST 25 01/22/2011   NA 130* 01/25/2011   K 4.1 01/25/2011   CL 99 01/25/2011   CREATININE 0.87 01/25/2011   BUN 9 01/25/2011   CO2 26 01/25/2011   TSH 1.743 01/22/2011

## 2011-02-22 NOTE — Assessment & Plan Note (Addendum)
Likely realated  to recent dehydration diarrhea, which was possibly due to bid nexium or other, now resolved,   to f/u any worsening symptoms or concerns, delcines f/u lab today

## 2011-02-22 NOTE — Assessment & Plan Note (Signed)
stable overall by hx and exam, most recent lab reviewed with pt, and pt to continue medical treatment as before  BP Readings from Last 3 Encounters:  02/22/11 118/72  01/15/11 142/68  01/10/11 132/58

## 2011-02-22 NOTE — Patient Instructions (Signed)
Please stop the relafen Continue all other medications as before, including the tramadol for pain

## 2011-02-22 NOTE — Assessment & Plan Note (Signed)
Improve, no further symtpoms -  to f/u any worsening symptoms or concerns

## 2011-02-22 NOTE — Progress Notes (Signed)
Subjective:    Patient ID: Robin Arroyo, female    DOB: 1919-08-06, 77 y.o.   MRN: [id number]  HPI  Here to f/u after recent hospn for CP, quite cognitively able for her age;  I saw her last with increase of nexium to 40 bid, but unfortunatley developed UTI and diarrhea soon after with decreased po intake, subsequent dehydration and found to have low sodium , as well as need for CP eval when presented to ER.  MI ruled out,  nexium reduced to 1 per day, xyzal tx for all ergies, lisinopril changed to 20 mg, and tramadol given for pain/nsaid d/c'd.  Overall doing well without further CP - Pt denies chest pain, increased sob or doe, wheezing, orthopnea, PND, increased LE swelling, palpitations, dizziness or syncope.  Pt denies new neurological symptoms such as new headache, or facial or extremity weakness or numbness   Pt denies polydipsia, polyuria, or low sugar symptoms such as weakness or confusion improved with po intake.  Pt states overall good compliance with meds, trying to follow lower cholesterol, diabetic diet, wt overall stable but little exercise however.     Pt denies fever, wt loss, night sweats, loss of appetite, or other constitutional symptoms  Denies worsening depressive symptoms, suicidal ideation, or panic, though has ongoing anxiety, not increased recently.   Overall good compliance with treatment, and good medicine tolerability.  No new complaints Past Medical History  Diagnosis Date  . Unspecified vitamin D deficiency 07/20/2009  . HYPERLIPIDEMIA 12/11/2007  . ANXIETY 12/11/2007  . INSOMNIA-SLEEP DISORDER-UNSPEC 12/11/2007  . DEPRESSION 12/11/2007  . HYPERTENSION 12/11/2007  . ALLERGIC RHINITIS 12/11/2007  . ASTHMA 12/11/2007  . GERD 12/11/2007  . HIATAL HERNIA 08/17/2010  . DIVERTICULOSIS, COLON 12/11/2007  . ARTHRITIS, RHEUMATOID 12/11/2007  . OSTEOARTHRITIS, KNEES, BILATERAL 01/18/2010  . OSTEOPOROSIS 12/11/2007  . INSOMNIA-SLEEP DISORDER-UNSPEC 06/08/2008  . FATIGUE 12/11/2007  . SKIN RASH  05/11/2008  . CONTUSION, LOWER LEG, RIGHT 01/19/2009  . COLONIC POLYPS, HYPERPLASTIC, HX OF 08/17/2010  . DERMATITIS FACTITIA 01/10/2011  . PRURITUS 01/10/2011  . CHEST PAIN 01/15/2011   Past Surgical History  Procedure Date  . Abdominal hysterectomy   . Cholecystectomy   . S/p bilateral knee replacements   . S/p bladder surgery   . Appendectomy   . Tonsillectomy     reports that she has never smoked. She does not have any smokeless tobacco history on file. She reports that she does not drink alcohol or use illicit drugs. family history includes Cancer in her mother and sister; Diabetes in her sister; and Prostate cancer in her brother and father. Allergies  Allergen Reactions  . Alendronate Sodium     REACTION: constipation  . Citalopram Hydrobromide   . Ibandronate Sodium     REACTION: gi upset  . Mirtazapine    Current Outpatient Prescriptions on File Prior to Visit  Medication Sig Dispense Refill  . alendronate (FOSAMAX) 70 MG tablet Take 70 mg by mouth every 7 (seven) days. Take with a full glass of water on an empty stomach.       . Cholecalciferol (VITAMIN D) 1000 UNITS capsule Take 1,000 Units by mouth daily.        Marland Kitchen esomeprazole (NEXIUM) 40 MG capsule Take 40 mg by mouth daily.        . fluticasone (FLOVENT HFA) 44 MCG/ACT inhaler Inhale 2 puffs into the lungs 2 (two) times daily.        Marland Kitchen levalbuterol (XOPENEX) 1.25 MG/3ML nebulizer solution  Take 1 ampule by nebulization every 4 (four) hours as needed.        Marland Kitchen levocetirizine (XYZAL) 5 MG tablet Take 5 mg by mouth daily.        Marland Kitchen lisinopril (PRINIVIL,ZESTRIL) 20 MG tablet Take 20 mg by mouth daily.        . polyethylene glycol (MIRALAX) powder Take 17 g by mouth. 17 gm in water by mouth once daily       . traMADol (ULTRAM) 50 MG tablet Take 50 mg by mouth every 6 (six) hours as needed.        Marland Kitchen DISCONTD: clotrimazole-betamethasone (LOTRISONE) cream Apply topically 2 (two) times daily.        Marland Kitchen DISCONTD: nabumetone (RELAFEN)  750 MG tablet Take 750 mg by mouth 2 (two) times daily as needed.        Marland Kitchen DISCONTD: ciprofloxacin (CIPRO) 500 MG tablet Take 500 mg by mouth 2 (two) times daily.         Review of Systems Review of Systems  Constitutional: Negative for diaphoresis and unexpected weight change.  HENT: Negative for drooling and tinnitus.   Eyes: Negative for photophobia and visual disturbance.  Respiratory: Negative for choking and stridor.   Gastrointestinal: Negative for vomiting and blood in stool.  Genitourinary: Negative for hematuria and decreased urine volume.  Musculoskeletal: Negative for gait problem.  Skin: Negative for color change and wound.  Neurological: Negative for tremors and numbness.  Psychiatric/Behavioral: Negative for decreased concentration. The patient is not hyperactive.       Objective:   Physical Exam BP 118/72  Pulse 76  Temp(Src) 97.8 F (36.6 C) (Oral)  Ht 4\' 10"  (1.473 m)  Wt 148 lb (67.132 kg)  BMI 30.93 kg/m2  SpO2 97% Physical Exam  VS noted Constitutional: Pt appears well-developed and thin, frail.  HENT: Head: Normocephalic.  Right Ear: External ear normal.  Left Ear: External ear normal.  Eyes: Conjunctivae and EOM are normal. Pupils are equal, round, and reactive to light.  Neck: Normal range of motion. Neck supple.  Cardiovascular: Normal rate and regular rhythm.   Pulmonary/Chest: Effort normal and breath sounds normal.  Abd:  Soft, NT, non-distended, + BS Neurological: Pt is alert. No cranial nerve deficit.  Skin: Skin is warm. No erythema.  Psychiatric: Pt behavior is normal. Thought content normal. 2+ nervous        Assessment & Plan:

## 2011-02-22 NOTE — Assessment & Plan Note (Signed)
I encouraged pt to cont the sertraline,  to f/u any worsening symptoms or concerns

## 2011-02-22 NOTE — Assessment & Plan Note (Addendum)
With recnet GI upset, will stop the nsaid, o/w stable overall by hx and exam, most recent lab reviewed with pt, and pt to continue medical treatment as before with the tramadol prn

## 2011-02-23 ENCOUNTER — Telehealth: Payer: Self-pay

## 2011-02-23 NOTE — Telephone Encounter (Signed)
Heather called requesting verbal order for a Home Heath Aide for this pt 3 x week

## 2011-02-23 NOTE — Telephone Encounter (Signed)
Heather advised. 

## 2011-02-23 NOTE — Telephone Encounter (Signed)
Ok for verbal 

## 2011-02-28 ENCOUNTER — Telehealth: Payer: Self-pay

## 2011-02-28 NOTE — Telephone Encounter (Signed)
[facility] called requesting verbal orders for a rolling walker for pt, verbal okay?

## 2011-02-28 NOTE — Telephone Encounter (Signed)
[facility] RN advised via VM

## 2011-02-28 NOTE — Telephone Encounter (Signed)
Ok for verbal 

## 2011-03-05 ENCOUNTER — Telehealth: Payer: Self-pay | Admitting: Internal Medicine

## 2011-03-05 NOTE — Telephone Encounter (Signed)
Spoke with patient's daughter. She states the patient is having acid reflux since returning from rehab about 3 weeks ago.  Patient went to rehab after a hospital stay that caused her to be weak.  Patient had problems with acid reflux when in hospital. She is taking Nexium 40 mg daily without much relief.Per  Dr. Raphael Gibney notes patient has been tried on Nexium BID and did not tolerate it well.Marland Kitchen She would like patient to be seen. Scheduled patient with Mike Gip, PA on 03/06/11 at 2:00 PM.

## 2011-03-06 ENCOUNTER — Ambulatory Visit (INDEPENDENT_AMBULATORY_CARE_PROVIDER_SITE_OTHER): Payer: Medicare Other | Admitting: Physician Assistant

## 2011-03-06 ENCOUNTER — Encounter: Payer: Self-pay | Admitting: Physician Assistant

## 2011-03-06 VITALS — BP 120/62 | HR 60 | Ht 60.0 in | Wt 148.0 lb

## 2011-03-06 DIAGNOSIS — K219 Gastro-esophageal reflux disease without esophagitis: Secondary | ICD-10-CM

## 2011-03-06 MED ORDER — SUCRALFATE 1 GM/10ML PO SUSP: 1.0000 g | Freq: Three times a day (TID) | ORAL | Status: DC

## 2011-03-06 MED ORDER — ESOMEPRAZOLE MAGNESIUM 40 MG PO CPDR: 40.0000 mg | DELAYED_RELEASE_CAPSULE | Freq: Every day | ORAL | Status: DC

## 2011-03-06 NOTE — Progress Notes (Signed)
Agree with assessment and initial plans as outlined 

## 2011-03-06 NOTE — Patient Instructions (Addendum)
We gave you samples of Nexium to take twice daily, 30 min before breakfast and supper x 3 weeks. Then go back to once daily.  We sent a prescription for Carafate Liquid to the pharmacy. We made you a follow up appointment with Dr. Juanda Chance for 04-17-2011  Diet for Genella Rife. Nutrition therapy can help ease the discomfort of gastroesophageal reflux disease (GERD) and peptic ulcer disease (PUD).  HOME CARE INSTRUCTIONS  Eat your meals slowly, in a relaxed setting.   Eat 5 to 6 small meals per day.   If a food causes distress, stop eating it for a period of time.  FOODS TO AVOID:  Coffee, regular or decaffeinated.  Cola beverages, regular or low calorie.   Tea, regular or decaffeinated.   Pepper.   Cocoa.   High fat foods including meats.   Butter, margarine, hydrogenated oil (trans fats).  Peppermint or spearmint (if you have GERD).   Fruits and vegetables as tolerated.   Alcoholic beverages.   Nicotine (smoking or chewing). This is one of the most potent stimulants to acid production in the gastrointestinal tract.   Any food that seems to aggravate your condition.   If you have questions regarding your diet, call your caregiver's office or a registered dietitian. OTHER TIPS IF YOU HAVE GERD:  Lying flat may make symptoms worse. Keep the head of your bed raised 6 to 9 inches by using a foam wedge or blocks under the legs of the bed.   Do not lay down until 3 hours after eating a meal.   Daily physical activity may help reduce symptoms.  MAKE SURE YOU:   Understand these instructions.   Will watch your condition.   Will get help right away if you are not doing well or get worse.  Document Released: 10/22/2005 Document Re-Released: 03/10/2009 [facility] Patient Information 2011 [place], [place].

## 2011-03-06 NOTE — Progress Notes (Signed)
  Subjective:    Patient ID: Robin Arroyo, female    DOB: [birth year], 77 y.o.   MRN: [id number]  HPI Robin Arroyo is very nice 77 year old white female known to Dr. Lina Sar who has a long history of GERD. She has been maintained on Nexium 40 mg daily. Her last endoscopy was done in 2004 and at that time she had a small hiatal hernia. She comes in today with complaints of several week history of increase in heartburn and reflux symptoms as well as a raw irritated feeling in her chest. She apparently was switched from Nexium to Prilosec once daily at some point within the past month or so and then was also diagnosed with urinary tract infection at an urgent care center. She was started on doxycycline. Within a couple of days of days  she had complaints of itching and burning and discomfort in her chest and was admitted to the hospital. Her urine was cultured she did have an enterococcus UTI and was then switched to ampicillin which he completed a course of. She was placed back on Nexium at 40 mg once daily. She then went to [facility] for a rehabilitation stay. She is continued to have a poor appetite intermittent nausea and complains of ongoing heartburn and indigestion. She denies any abdominal pain not had any diarrhea no melena or hematochezia says her bowel movements are fairly normal. She has been supplementing her Nexium with Tums up to 3 times daily which she does feel is helpful. On further questioning she does not really complain of any odynophagia at this time but may have some intermittent dysphagia especially to rice. She says over the past few days she's been eating very bland and has been feeling better. She is not on any aspirin or NSAIDs at this time.    Review of Systems  Constitutional: Positive for appetite change.  HENT: Negative.   Eyes: Negative.   Respiratory: Negative.   Cardiovascular: Positive for chest pain.  Genitourinary: Positive for dysuria and enuresis.    Musculoskeletal: Negative.   Skin: Negative.   Neurological: Negative.   Hematological: Negative.   Psychiatric/Behavioral: Negative.        Objective:   Physical Exam Well-developed elderly white female in no acute distress Sitting in a wheelchair, pleasant and alert and oriented x3  HEENT; nontraumatic normocephalic EOMI PERRLA sclera anicteric dentition poor missing one of her front teeth tongue slightly dry with multiple tiny pinpoint yellowish plaques, neck supple; no JVD, Cardiovascular ;regular rate and rhythm with S1-S2 no murmur rub or gallop  Pulmonary; clear bilaterally  Abdomen; soft nontender nondistended bowel sounds active no palpable mass or hepatosplenomegaly  Rectal; not done  Extremities ;2+ edema in the feet laterally Psych mood and affect normal and appropriate.        Assessment & Plan:  #32 77 year old white female with history of chronic GERD with worsening of symptoms over the past month and probable acute esophagitis, multifactorial, and likely antibiotic-induced.  Plan; Will try conservative measures first with increasing Nexium to 40 mg by mouth twice daily x3 weeks Add Carafate slurry 1 g 3 times daily between meals and at bedtime x2-3 weeks. Continue bland diet Plan returned office visit with Dr. Lina Sar in 3-4 weeks, and if her symptoms are persisting at that time she will need upper endoscopy.  #2 Mild oral thrush We'll use Magic mouthwash 5 cc swish and spit 4 times daily x10 days.

## 2011-03-06 NOTE — Telephone Encounter (Signed)
Reviewed and agree.

## 2011-03-07 ENCOUNTER — Telehealth: Payer: Self-pay | Admitting: *Deleted

## 2011-03-07 ENCOUNTER — Other Ambulatory Visit: Payer: Self-pay | Admitting: *Deleted

## 2011-03-07 DIAGNOSIS — B370 Candidal stomatitis: Secondary | ICD-10-CM

## 2011-03-07 MED ORDER — MAGIC MOUTHWASH: ORAL | Status: DC

## 2011-03-07 NOTE — Telephone Encounter (Signed)
Patient notified and rx sent. 

## 2011-03-07 NOTE — Telephone Encounter (Signed)
Message copied by Jesse Fall on Wed Mar 07, 2011  9:12 AM ------      Message from: [place], [place]      Created: Tue Mar 06, 2011  5:23 PM       Rene Kocher, i saw this lady today, meant to give her some magic mouthwash 5 cc 4 x daily x 10 days . Can you please call in , and let her know-thanks

## 2011-03-07 NOTE — Telephone Encounter (Signed)
Called [facility] [address], [place] and spoke to Teacher, early years/pre, [employer].  I asked if she had Magic Mouthwash made up and she said they do have it with Nystatin, Hydrocortisone, Diphenhydramine and Lidocaine. I told her to use that.  We had picked a Magic Mouthwash in [employer] that had Alum. Mag. Hydroxide.  They didn't have the ingredients to make the one we picked. I gave the pharmacist my name.

## 2011-03-08 MED ORDER — MAGIC MOUTHWASH: 5.0000 mL | Freq: Three times a day (TID) | ORAL | Status: DC

## 2011-03-10 ENCOUNTER — Emergency Department (HOSPITAL_COMMUNITY): Payer: Medicare Other

## 2011-03-10 ENCOUNTER — Emergency Department (HOSPITAL_COMMUNITY)
Admission: EM | Admit: 2011-03-10 | Discharge: 2011-03-10 | Disposition: A | Discharge status: Home / Self Care | Payer: Medicare Other | Attending: Emergency Medicine | Admitting: Emergency Medicine

## 2011-03-10 DIAGNOSIS — J45909 Unspecified asthma, uncomplicated: Secondary | ICD-10-CM | POA: Insufficient documentation

## 2011-03-10 DIAGNOSIS — Z79899 Other long term (current) drug therapy: Secondary | ICD-10-CM | POA: Insufficient documentation

## 2011-03-10 DIAGNOSIS — R110 Nausea: Secondary | ICD-10-CM | POA: Insufficient documentation

## 2011-03-10 DIAGNOSIS — K209 Esophagitis, unspecified without bleeding: Secondary | ICD-10-CM | POA: Insufficient documentation

## 2011-03-10 DIAGNOSIS — F411 Generalized anxiety disorder: Secondary | ICD-10-CM | POA: Insufficient documentation

## 2011-03-10 DIAGNOSIS — R0789 Other chest pain: Secondary | ICD-10-CM | POA: Insufficient documentation

## 2011-03-10 DIAGNOSIS — K219 Gastro-esophageal reflux disease without esophagitis: Secondary | ICD-10-CM | POA: Insufficient documentation

## 2011-03-10 LAB — POCT I-STAT, CHEM 8
BUN: 12 mg/dL (ref 6–23)
Calcium, Ion: 1.12 mmol/L (ref 1.12–1.32)
Chloride: 93 mEq/L — ABNORMAL LOW (ref 96–112)
Creatinine, Ser: 1 mg/dL (ref 0.4–1.2)
Glucose, Bld: 103 mg/dL — ABNORMAL HIGH (ref 70–99)

## 2011-03-10 LAB — CBC
MCH: 30.5 pg (ref 26.0–34.0)
MCHC: 34.6 g/dL (ref 30.0–36.0)
MCV: 88.2 fL (ref 78.0–100.0)
Platelets: 275 10*3/uL (ref 150–400)
RBC: 4.42 MIL/uL (ref 3.87–5.11)
RDW: 12.1 % (ref 11.5–15.5)

## 2011-03-10 LAB — POCT CARDIAC MARKERS: Troponin i, poc: <0.05 ng/mL (ref 0.00–0.09)

## 2011-03-10 LAB — DIFFERENTIAL
Basophils Relative: 0 % (ref 0–1)
Eosinophils Absolute: 0 10*3/uL (ref 0.0–0.7)
Eosinophils Relative: 0 % (ref 0–5)
Lymphs Abs: 1.2 10*3/uL (ref 0.7–4.0)
Monocytes Relative: 8 % (ref 3–12)
Neutrophils Relative %: 67 % (ref 43–77)

## 2011-03-12 ENCOUNTER — Telehealth: Payer: Self-pay

## 2011-03-12 ENCOUNTER — Telehealth: Payer: Self-pay | Admitting: Internal Medicine

## 2011-03-12 NOTE — Telephone Encounter (Signed)
Physical Therapist advised via secure VM

## 2011-03-12 NOTE — Telephone Encounter (Signed)
Patient went to the ER for chest pain and was prescribed lorazepam and ultram.  She fell this am because she says it is too much medicine.  I have reviewed with her that she should take them only as needed and not together.  Her CP is better after taking carafate and maalox. She will call back for any further questions.

## 2011-03-12 NOTE — Telephone Encounter (Signed)
Heather called requesting verbal orders to continue PT, OT and Home Health Aide. Pt called 911 Saturday for chest pain, nausea and burning in her chest. Pt was Dx'd with GERD and sent home but feel yesterday while bending to pick up some medicine. PT wants to continue seeing pt due to this change in status, Okay for verbal?

## 2011-03-12 NOTE — Telephone Encounter (Signed)
Ok for verbal 

## 2011-03-13 ENCOUNTER — Telehealth: Payer: Self-pay | Admitting: Physician Assistant

## 2011-03-13 ENCOUNTER — Telehealth: Payer: Self-pay | Admitting: Internal Medicine

## 2011-03-13 NOTE — Telephone Encounter (Signed)
Patient does not want to see Dr. Jonny Ruiz. Explained to patient that we could not tell Dr. Jonny Ruiz he does not need to see her because we do not know why he wants to see her. Explained she needs to talk with Dr. Raphael Gibney office re: his appointment.

## 2011-03-13 NOTE — Telephone Encounter (Signed)
Spoke with patient and she states she is still having burning in her chest after going to the ER. Patient wants to be seen by Mike Gip, PA or Dr. Juanda Chance. Explained to patient that I can schedule her with Willette Cluster, NP for tomorrow. After she talked about her ER visit, she states she thinks she will call Dr. Jonny Ruiz back and schedule with him first then she may schedule with [place] after that. Patient's daughter was on phone also.

## 2011-03-14 ENCOUNTER — Ambulatory Visit (INDEPENDENT_AMBULATORY_CARE_PROVIDER_SITE_OTHER): Payer: Medicare Other | Admitting: Internal Medicine

## 2011-03-14 ENCOUNTER — Other Ambulatory Visit (INDEPENDENT_AMBULATORY_CARE_PROVIDER_SITE_OTHER): Payer: Medicare Other

## 2011-03-14 ENCOUNTER — Encounter: Payer: Self-pay | Admitting: Internal Medicine

## 2011-03-14 DIAGNOSIS — E871 Hypo-osmolality and hyponatremia: Secondary | ICD-10-CM

## 2011-03-14 DIAGNOSIS — R300 Dysuria: Secondary | ICD-10-CM

## 2011-03-14 DIAGNOSIS — K219 Gastro-esophageal reflux disease without esophagitis: Secondary | ICD-10-CM

## 2011-03-14 DIAGNOSIS — F411 Generalized anxiety disorder: Secondary | ICD-10-CM

## 2011-03-14 LAB — BASIC METABOLIC PANEL
Calcium: 9.4 mg/dL (ref 8.4–10.5)
GFR: 62.53 mL/min (ref >60.00)
Glucose, Bld: 99 mg/dL (ref 70–99)
Potassium: 4.8 mEq/L (ref 3.5–5.1)
Sodium: 129 mEq/L — ABNORMAL LOW (ref 135–145)

## 2011-03-14 LAB — URINALYSIS, ROUTINE W REFLEX MICROSCOPIC
Ketones, ur: NEGATIVE
Leukocytes, UA: NEGATIVE
Nitrite: NEGATIVE
Specific Gravity, Urine: 1.01 (ref 1.000–1.030)
Urobilinogen, UA: 0.2 (ref 0.0–1.0)
pH: 7.5 (ref 5.0–8.0)

## 2011-03-14 MED ORDER — ESOMEPRAZOLE MAGNESIUM 40 MG PO CPDR: 40.0000 mg | DELAYED_RELEASE_CAPSULE | Freq: Two times a day (BID) | ORAL | Status: DC

## 2011-03-14 MED ORDER — ESOMEPRAZOLE MAGNESIUM 40 MG PO CPDR: 40.0000 mg | DELAYED_RELEASE_CAPSULE | Freq: Every day | ORAL | Status: DC

## 2011-03-14 NOTE — Assessment & Plan Note (Signed)
Improved with bid nexium - ok to continue as is;  to f/u any worsening symptoms or concerns, also sees Dr Marlowe Shores

## 2011-03-14 NOTE — Patient Instructions (Addendum)
OK to stop the lorazepam from the hospital Continue all other medications as before, including the nexium at twice per day Please go to LAB in the Basement for the blood and/or urine tests to be done today Please call the number on the [facility] Card (the [employer] System) for results of testing in 2-3 days

## 2011-03-14 NOTE — Assessment & Plan Note (Signed)
Mild, recent onset ; last UTI just march 2012 - for repeat urine study today, tx as approp pending lab results

## 2011-03-14 NOTE — Assessment & Plan Note (Signed)
I think pt should increase the zoloft but she declines today;  to f/u any worsening symptoms or concerns

## 2011-03-14 NOTE — Assessment & Plan Note (Signed)
With recent hospn - for f/u today, to f/u any worsening symptoms or concerns

## 2011-03-15 ENCOUNTER — Other Ambulatory Visit: Payer: Self-pay | Admitting: Physician Assistant

## 2011-03-15 ENCOUNTER — Telehealth: Payer: Self-pay | Admitting: Internal Medicine

## 2011-03-15 NOTE — Progress Notes (Signed)
Quick Note:  Voice message left on [employer] system - lab is negative, normal or otherwise stable, pt to continue same tx ______ 

## 2011-03-15 NOTE — Telephone Encounter (Signed)
Spoke with patient and told her per Mike Gip, PA note from OV on 03/06/11 she is to continue on the Carafate for 2-3 weeks. She will call her pharmacy for a refill.

## 2011-03-16 LAB — URINE CULTURE

## 2011-03-16 NOTE — Progress Notes (Signed)
Quick Note:  Voice message left on [employer] system - lab is negative, normal or otherwise stable, pt to continue same tx ______ 

## 2011-03-18 ENCOUNTER — Encounter: Payer: Self-pay | Admitting: Internal Medicine

## 2011-03-18 NOTE — Progress Notes (Signed)
Subjective:    Patient ID: Robin Arroyo, female    DOB: [birth year], 77 y.o.   MRN: [id number]  HPI    Here to follow up with family; overall doing quite nicely post hospn;  Does not take the lorazepam as it was too strong,  And does have some small dysuria the last 2 days of ? Clinical signficance - no back pain, fever, freq, urgency, hematuria, n/v, fever or chills;  Denies worsening reflux, dysphagia, abd pain, n/v, bowel change or blood.  Denies worsening depressive symptoms, suicidal ideation, or panic, though has ongoing anxiety, but does not want further tx at this time.  Did have some mild low sodium and as per d/c summary this will be repeated today as well. Pt denies chest pain, increased sob or doe, wheezing, orthopnea, PND, increased LE swelling, palpitations, dizziness or syncope.  Pt denies new neurological symptoms such as new headache, or facial or extremity weakness or numbness   Pt denies polydipsia, polyuria, or low sugar symptoms such as weakness or confusion improved with po intake.  Pt states overall good compliance with meds, trying to follow lower cholesterol, diabetic diet, wt overall stable but little exercise however.    Past Medical History  Diagnosis Date  . Unspecified vitamin D deficiency 07/20/2009  . HYPERLIPIDEMIA 12/11/2007  . ANXIETY 12/11/2007  . INSOMNIA-SLEEP DISORDER-UNSPEC 12/11/2007  . DEPRESSION 12/11/2007  . HYPERTENSION 12/11/2007  . ALLERGIC RHINITIS 12/11/2007  . ASTHMA 12/11/2007  . GERD 12/11/2007  . HIATAL HERNIA 08/17/2010  . DIVERTICULOSIS, COLON 12/11/2007  . ARTHRITIS, RHEUMATOID 12/11/2007  . OSTEOARTHRITIS, KNEES, BILATERAL 01/18/2010  . OSTEOPOROSIS 12/11/2007  . INSOMNIA-SLEEP DISORDER-UNSPEC 06/08/2008  . FATIGUE 12/11/2007  . SKIN RASH 05/11/2008  . CONTUSION, LOWER LEG, RIGHT 01/19/2009  . COLONIC POLYPS, HYPERPLASTIC, HX OF 08/17/2010  . DERMATITIS FACTITIA 01/10/2011  . PRURITUS 01/10/2011  . CHEST PAIN 01/15/2011  . Single kidney     functional   .  Lumbar spine pain     DJD  . Hearing loss     hearing aids  . Vitamin D deficiency    Past Surgical History  Procedure Date  . Abdominal hysterectomy   . Cholecystectomy   . S/p bilateral knee replacements   . S/p bladder surgery   . Appendectomy   . Tonsillectomy     reports that she has never smoked. She does not have any smokeless tobacco history on file. She reports that she does not drink alcohol or use illicit drugs. family history includes Cancer in her mother and sister; Diabetes in her sister; and Prostate cancer in her brother and father. Allergies  Allergen Reactions  . Alendronate Sodium     REACTION: constipation  . Citalopram Hydrobromide   . Ibandronate Sodium     REACTION: gi upset  . Mirtazapine    Current Outpatient Prescriptions on File Prior to Visit  Medication Sig Dispense Refill  . Alum & Mag Hydroxide-Simeth (MAGIC MOUTHWASH) SOLN Take 5 mLs by mouth 3 (three) times daily.  450 mL  0  . Cholecalciferol (VITAMIN D) 1000 UNITS capsule Take 1,000 Units by mouth daily.        . clotrimazole-betamethasone (LOTRISONE) cream Apply topically 2 (two) times daily.  30 g  1  . fluticasone (FLOVENT HFA) 44 MCG/ACT inhaler Inhale 2 puffs into the lungs 2 (two) times daily.        Marland Kitchen levalbuterol (XOPENEX) 1.25 MG/3ML nebulizer solution Take 1 ampule by nebulization every 4 (four) hours as  needed.        Marland Kitchen levocetirizine (XYZAL) 5 MG tablet Take 5 mg by mouth daily.        Marland Kitchen lisinopril (PRINIVIL,ZESTRIL) 20 MG tablet Take 20 mg by mouth daily.        . polyethylene glycol (MIRALAX) powder Take 17 g by mouth. 17 gm in water by mouth once daily       . sertraline (ZOLOFT) 50 MG tablet Take 1 tablet (50 mg total) by mouth daily.  30 tablet  2  . traMADol (ULTRAM) 50 MG tablet Take 50 mg by mouth every 6 (six) hours as needed.        . Calcium Carbonate Antacid (TUMS PO) Take by mouth 3 (three) times daily as needed.         Review of Systems Review of Systems    Constitutional: Negative for diaphoresis and unexpected weight change.  HENT: Negative for drooling and tinnitus.   Eyes: Negative for photophobia and visual disturbance.  Respiratory: Negative for choking and stridor.   Gastrointestinal: Negative for vomiting and blood in stool.  Genitourinary: Negative for hematuria and decreased urine volume.  Musculoskeletal: Negative for gait problem.  Skin: Negative for color change and wound.  Neurological: Negative for tremors and numbness.  Psychiatric/Behavioral: Negative for decreased concentration. The patient is not hyperactive.       Objective:   Physical Exam BP 140/70  Pulse 67  Temp(Src) 97.8 F (36.6 C) (Oral)  Ht 5' (1.524 m)  Wt 148 lb (67.132 kg)  BMI 28.90 kg/m2  SpO2 97% Physical Exam  VS noted Constitutional: Pt appears well-developed but frail  HENT: Head: Normocephalic.  Right Ear: External ear normal.  Left Ear: External ear normal.  Eyes: Conjunctivae and EOM are normal. Pupils are equal, round, and reactive to light.  Neck: Normal range of motion. Neck supple.  Cardiovascular: Normal rate and regular rhythm.   Pulmonary/Chest: Effort normal and breath sounds normal.  Abd:  Soft, NT, non-distended, + BS Neurological: Pt is alert. No cranial nerve deficit.  Skin: Skin is warm. No erythema.  Psychiatric: Pt behavior is normal. Thought content normal.  2+ nervous       Assessment & Plan:

## 2011-03-19 ENCOUNTER — Other Ambulatory Visit: Payer: Self-pay | Admitting: Internal Medicine

## 2011-03-28 ENCOUNTER — Telehealth: Payer: Self-pay | Admitting: Internal Medicine

## 2011-03-28 MED ORDER — ONDANSETRON HCL 4 MG PO TABS: ORAL_TABLET | ORAL | Status: DC

## 2011-03-28 NOTE — Telephone Encounter (Signed)
It is OK to reduce the nexiem to 40 mg po qd ( am) , and continue Carafate 1 gm po bid..., Please send her Zofren 4 mg, # 15 1/2 or 1 po q 8 hrs prn nausea

## 2011-03-28 NOTE — Telephone Encounter (Signed)
Patient calling to report she is still having"burning in her chest." States it is some better but not completely gone. Also states she had some nausea this AM and does not have any appetite. She is still taking her Nexium BID and Carafate but states she will finish up with this on the weekend. She wants to know should she continue with the Carafate until visit with Dr. Juanda Chance on 04/17/11. Also would like something for nausea. Please, advise.

## 2011-03-28 NOTE — Telephone Encounter (Signed)
Patient given Dr. Regino Schultze recommendations. Rx for Zofran sent to pharmacy.

## 2011-03-30 ENCOUNTER — Other Ambulatory Visit: Payer: Self-pay | Admitting: Physician Assistant

## 2011-04-03 ENCOUNTER — Telehealth: Payer: Self-pay | Admitting: *Deleted

## 2011-04-03 ENCOUNTER — Telehealth: Payer: Self-pay | Admitting: Internal Medicine

## 2011-04-03 NOTE — Telephone Encounter (Signed)
I called the pharamacy [facility] [address] and the pt did pick up a prescription on 03-25-2011.

## 2011-04-03 NOTE — Telephone Encounter (Signed)
Received a message from Huntley Dec at [employer] that patient needs a refill on Carafate. States they have faxed a request but not received it back. Called in rx for Carafate 1 Gm po BID as per Dr. Regino Schultze note on 03/28/11.

## 2011-04-03 NOTE — Telephone Encounter (Signed)
Patient will call her pharmacy for refill on Carafate. She did receive her Zofran rx but she needs a refill on Carafate.

## 2011-04-06 ENCOUNTER — Telehealth: Payer: Self-pay | Admitting: Internal Medicine

## 2011-04-06 NOTE — Telephone Encounter (Signed)
Patient states her pharmacy is calling and telling her they have her Carafate ready. She states she already has Carafate and wants to know if she needs to pick up more. Patient instructed to continue Carafate po she has.

## 2011-04-08 ENCOUNTER — Emergency Department (HOSPITAL_COMMUNITY): Payer: Medicare Other

## 2011-04-08 ENCOUNTER — Emergency Department (HOSPITAL_COMMUNITY)
Admission: EM | Admit: 2011-04-08 | Discharge: 2011-04-08 | Disposition: A | Discharge status: Home / Self Care | Payer: Medicare Other | Attending: Emergency Medicine | Admitting: Emergency Medicine

## 2011-04-08 DIAGNOSIS — K219 Gastro-esophageal reflux disease without esophagitis: Secondary | ICD-10-CM | POA: Insufficient documentation

## 2011-04-08 DIAGNOSIS — S0990XA Unspecified injury of head, initial encounter: Secondary | ICD-10-CM | POA: Insufficient documentation

## 2011-04-08 DIAGNOSIS — Y92009 Unspecified place in unspecified non-institutional (private) residence as the place of occurrence of the external cause: Secondary | ICD-10-CM | POA: Insufficient documentation

## 2011-04-08 DIAGNOSIS — Y998 Other external cause status: Secondary | ICD-10-CM | POA: Insufficient documentation

## 2011-04-08 DIAGNOSIS — J45909 Unspecified asthma, uncomplicated: Secondary | ICD-10-CM | POA: Insufficient documentation

## 2011-04-08 DIAGNOSIS — F329 Major depressive disorder, single episode, unspecified: Secondary | ICD-10-CM | POA: Insufficient documentation

## 2011-04-08 DIAGNOSIS — Z79899 Other long term (current) drug therapy: Secondary | ICD-10-CM | POA: Insufficient documentation

## 2011-04-08 DIAGNOSIS — S0180XA Unspecified open wound of other part of head, initial encounter: Secondary | ICD-10-CM | POA: Insufficient documentation

## 2011-04-08 DIAGNOSIS — W050XXA Fall from non-moving wheelchair, initial encounter: Secondary | ICD-10-CM | POA: Insufficient documentation

## 2011-04-08 DIAGNOSIS — F3289 Other specified depressive episodes: Secondary | ICD-10-CM | POA: Insufficient documentation

## 2011-04-08 DIAGNOSIS — I10 Essential (primary) hypertension: Secondary | ICD-10-CM | POA: Insufficient documentation

## 2011-04-11 ENCOUNTER — Telehealth: Payer: Self-pay | Admitting: Internal Medicine

## 2011-04-11 ENCOUNTER — Telehealth: Payer: Self-pay | Admitting: *Deleted

## 2011-04-11 NOTE — Telephone Encounter (Signed)
Ok to addon for [facility] June 11 for stitches out,  And ask if she has family to help with the confusion (consider OV, ER or urgent care sooner if seems worse than usual)

## 2011-04-11 NOTE — Telephone Encounter (Signed)
Patient meant to call Dr. Raphael Gibney office

## 2011-04-11 NOTE — Telephone Encounter (Signed)
Pt inquiring about having stitches removed that she received on Sunday, was told 7-10 days. Pt seems confused and is requesting a callback.

## 2011-04-12 NOTE — Telephone Encounter (Signed)
Called patient and informed of instructions. She will call back and schedule OV for Monday to remove stitches.

## 2011-04-16 ENCOUNTER — Other Ambulatory Visit (INDEPENDENT_AMBULATORY_CARE_PROVIDER_SITE_OTHER): Payer: Medicare Other

## 2011-04-16 ENCOUNTER — Encounter: Payer: Self-pay | Admitting: Internal Medicine

## 2011-04-16 ENCOUNTER — Ambulatory Visit (INDEPENDENT_AMBULATORY_CARE_PROVIDER_SITE_OTHER): Payer: Medicare Other | Admitting: Internal Medicine

## 2011-04-16 VITALS — BP 118/82 | HR 65 | Temp 97.9°F | Ht 60.0 in | Wt 133.5 lb

## 2011-04-16 DIAGNOSIS — I10 Essential (primary) hypertension: Secondary | ICD-10-CM

## 2011-04-16 DIAGNOSIS — E871 Hypo-osmolality and hyponatremia: Secondary | ICD-10-CM

## 2011-04-16 DIAGNOSIS — S0100XA Unspecified open wound of scalp, initial encounter: Secondary | ICD-10-CM

## 2011-04-16 DIAGNOSIS — S0101XA Laceration without foreign body of scalp, initial encounter: Secondary | ICD-10-CM | POA: Insufficient documentation

## 2011-04-16 DIAGNOSIS — F329 Major depressive disorder, single episode, unspecified: Secondary | ICD-10-CM

## 2011-04-16 LAB — BASIC METABOLIC PANEL
CO2: 30 mEq/L (ref 19–32)
Calcium: 9.5 mg/dL (ref 8.4–10.5)
Chloride: 94 mEq/L — ABNORMAL LOW (ref 96–112)
Creatinine, Ser: 0.9 mg/dL (ref 0.4–1.2)
Sodium: 131 mEq/L — ABNORMAL LOW (ref 135–145)

## 2011-04-16 NOTE — Assessment & Plan Note (Signed)
Well healing, staples removed, no further eval or tx needed

## 2011-04-16 NOTE — Assessment & Plan Note (Signed)
stable overall by hx and exam, most recent data reviewed with pt, and pt to continue medical treatment as before  Lab Results  Component Value Date   WBC 4.9 03/10/2011   HGB 13.9 03/10/2011   HCT 41.0 03/10/2011   PLT 275 03/10/2011   CHOL 164 01/10/2011   TRIG 111.0 01/10/2011   HDL 51.90 01/10/2011   LDLDIRECT 114.6 12/11/2007   ALT 15 01/22/2011   AST 25 01/22/2011   NA 129* 03/14/2011   K 4.8 03/14/2011   CL 91* 03/14/2011   CREATININE 0.9 03/14/2011   BUN 8 03/14/2011   CO2 31 03/14/2011   TSH 1.743 01/22/2011

## 2011-04-16 NOTE — Assessment & Plan Note (Signed)
stable overall by hx and exam, most recent data reviewed with pt, and pt to continue medical treatment as before  BP Readings from Last 3 Encounters:  04/16/11 118/82  03/14/11 140/70  03/06/11 120/62
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
## 2011-04-16 NOTE — Patient Instructions (Addendum)
Continue all other medications as before Your 2 staples were removed today Please keep your appointments with your specialists as you have planned - Dr Juanda Chance tomorrow Please go to LAB in the Basement for the blood and/or urine tests to be done today - to check the sodium Please call the phone number [phone] (the PhoneTree System) for results of testing in 2-3 days;  When calling, simply dial the number, and when prompted enter the MRN number above (the Medical Record Number) and the # key, then the message should start. We'll see you at your next appt - Nov 7

## 2011-04-16 NOTE — Assessment & Plan Note (Signed)
No apparetn symtpoms, to re-check with recent fall however

## 2011-04-17 ENCOUNTER — Ambulatory Visit (INDEPENDENT_AMBULATORY_CARE_PROVIDER_SITE_OTHER): Payer: Medicare Other | Admitting: Internal Medicine

## 2011-04-17 ENCOUNTER — Ambulatory Visit (INDEPENDENT_AMBULATORY_CARE_PROVIDER_SITE_OTHER)
Admission: RE | Admit: 2011-04-17 | Discharge: 2011-04-17 | Disposition: A | Discharge status: Home / Self Care | Payer: Medicare Other | Source: Ambulatory Visit | Attending: Internal Medicine | Admitting: Internal Medicine

## 2011-04-17 ENCOUNTER — Encounter: Payer: Self-pay | Admitting: Internal Medicine

## 2011-04-17 ENCOUNTER — Other Ambulatory Visit (INDEPENDENT_AMBULATORY_CARE_PROVIDER_SITE_OTHER): Payer: Medicare Other

## 2011-04-17 VITALS — BP 128/62 | HR 73 | Temp 97.8°F | Ht 60.0 in | Wt 133.0 lb

## 2011-04-17 DIAGNOSIS — M25519 Pain in unspecified shoulder: Secondary | ICD-10-CM

## 2011-04-17 DIAGNOSIS — Z86010 Personal history of colonic polyps: Secondary | ICD-10-CM

## 2011-04-17 DIAGNOSIS — S42123A Displaced fracture of acromial process, unspecified shoulder, initial encounter for closed fracture: Secondary | ICD-10-CM

## 2011-04-17 DIAGNOSIS — M25512 Pain in left shoulder: Secondary | ICD-10-CM | POA: Insufficient documentation

## 2011-04-17 DIAGNOSIS — R079 Chest pain, unspecified: Secondary | ICD-10-CM

## 2011-04-17 DIAGNOSIS — E8779 Other fluid overload: Secondary | ICD-10-CM

## 2011-04-17 DIAGNOSIS — R1310 Dysphagia, unspecified: Secondary | ICD-10-CM

## 2011-04-17 DIAGNOSIS — I10 Essential (primary) hypertension: Secondary | ICD-10-CM

## 2011-04-17 HISTORY — DX: Displaced fracture of acromial process, unspecified shoulder, initial encounter for closed fracture: S42.123A

## 2011-04-17 LAB — POTASSIUM: Potassium: 4.8 mEq/L (ref 3.5–5.1)

## 2011-04-17 MED ORDER — HYDROCODONE-ACETAMINOPHEN 5-325 MG PO TABS: 1.0000 | ORAL_TABLET | ORAL | Status: AC | PRN

## 2011-04-17 MED ORDER — METHYLPREDNISOLONE 4 MG PO KIT: PACK | ORAL | Status: AC

## 2011-04-17 MED ORDER — METHYLPREDNISOLONE ACETATE 80 MG/ML IJ SUSP
120.0000 mg | Freq: Once | INTRAMUSCULAR | Status: AC
  Administered 2011-04-17: 120 mg via INTRAMUSCULAR

## 2011-04-17 MED ORDER — MAGIC MOUTHWASH: 5.0000 mL | Freq: Three times a day (TID) | ORAL | Status: DC

## 2011-04-17 NOTE — Assessment & Plan Note (Signed)
With severe pain, and marked swelling today; diff includes trauatic fx/dislocation/rotater cuff, gout, DJD;  Doubt infection;  For depomedrol IM today, medrol dose pack for home (has confusion with prednisone), and vicodin prn (has had some confusion in the past with a narcotic but not sure which one)

## 2011-04-17 NOTE — Progress Notes (Signed)
Robin Arroyo [birth year] MRN [id number]      History of Present Illness:  This is a 77 year old white female who is status post recent hospitalization for chest pain attributed to gastroesophageal reflux and esophageal spasm. Cardiac causes were ruled out. She has dysphagia to liquids and solids. An upper endoscopy in April 2004 and before that in 2001 showed a 2 cm hiatal hernia with biopsies showing normal mucosa. She had a negative 24 hour intraesophageal pH probe in 2004 while off Nexiem. She has hoarseness and cough with eating. She is status post remote cholecystectomy. A colonoscopy in April 2004 showed a hyperplastic polyp. Her last visit was in 03/06/2011 with Robin Easterwood  PA, at which time she was put on Nexium 40 mg twice a day and Carafate slurry. The chest pain has subsided but dysphagia continues.   Past Medical History  Diagnosis Date  . Unspecified vitamin D deficiency 07/20/2009  . HYPERLIPIDEMIA 12/11/2007  . ANXIETY 12/11/2007  . INSOMNIA-SLEEP DISORDER-UNSPEC 12/11/2007  . DEPRESSION 12/11/2007  . HYPERTENSION 12/11/2007  . ALLERGIC RHINITIS 12/11/2007  . ASTHMA 12/11/2007  . GERD 12/11/2007  . HIATAL HERNIA 08/17/2010  . DIVERTICULOSIS, COLON 12/11/2007  . ARTHRITIS, RHEUMATOID 12/11/2007  . OSTEOARTHRITIS, KNEES, BILATERAL 01/18/2010  . OSTEOPOROSIS 12/11/2007  . INSOMNIA-SLEEP DISORDER-UNSPEC 06/08/2008  . FATIGUE 12/11/2007  . SKIN RASH 05/11/2008  . CONTUSION, LOWER LEG, RIGHT 01/19/2009  . COLONIC POLYPS, HYPERPLASTIC, HX OF 08/17/2010  . DERMATITIS FACTITIA 01/10/2011  . PRURITUS 01/10/2011  . CHEST PAIN 01/15/2011  . Single kidney     functional   . Lumbar spine pain     DJD  . Hearing loss     hearing aids  . Vitamin D deficiency    Past Surgical History  Procedure Date  . Abdominal hysterectomy   . Cholecystectomy   . S/p bilateral knee replacements   . S/p bladder surgery   . Appendectomy   . Tonsillectomy     reports that she has quit smoking. She does not have  any smokeless tobacco history on file. She reports that she does not drink alcohol or use illicit drugs. family history includes Breast cancer in her mother and sister; Cancer in her father; Diabetes in her sister; Prostate cancer in her brothers and father; and Stomach cancer in her sister.  There is no history of Colon cancer. Allergies  Allergen Reactions  . Alendronate Sodium     REACTION: constipation  . Citalopram Hydrobromide   . Ibandronate Sodium     REACTION: gi upset  . Mirtazapine         Review of Systems: Denies shortness of breath or chest pain. Has irregular bowel habits. No rectal bleeding  The remainder of the 10  point ROS is negative except as outlined in H&P   Physical Exam: General appearance  Well developed in no distress, appearing younger than her stated age, raspy voice. Eyes- non icteric. HEENT nontraumatic, normocephalic. Mouth no lesions, tongue papillated, no cheilosis. Neck supple without adenopathy, thyroid not enlarged, no carotid bruits, no JVD. Lungs Clear to auscultation bilaterally. Cor normal S1 normal S2, regular rhythm , no murmur,  quiet precordium. Abdomen soft nontender abdomen with normal active bowel sounds. No palpable mass. Rectal: Decreased rectal sphincter tone. Soft Hemoccult negative stool. Extremities no pedal edema. Skin no lesions. Neurological alert and oriented x 3. Psychological normal mood and affect.  Assessment and Plan  Problem #1 dysphagia to solids and liquids is most likely due to esophageal dysmotility.  This may be age related to presbyesophagus. We will obtain a barium esophagram. Some of her symptoms are suggestive of aspiration. She may need a speech pathology consult  to assess her risk for aspiration. If the barium esophagram shows an esophageal stricture, we will go ahead with endoscopy and dilatation. She will continue on Nexium 40 mg daily and stop her Carafate at this time. Antireflux measures should be  followed as well.  Problem #2 left shoulder injury. She will see Dr. Jonny Arroyo for that.  Problem #3 colon polyps. Patient is not a candidate for screening colonoscopy due to age.   04/17/2011 Robin Arroyo

## 2011-04-17 NOTE — Progress Notes (Signed)
Addended by: Corwin Levins on: 04/17/2011 10:15 PM   Modules accepted: Orders

## 2011-04-17 NOTE — Assessment & Plan Note (Signed)
As per film done today - will refer orthopedic, Continue all other medications as before

## 2011-04-17 NOTE — Patient Instructions (Addendum)
You have been scheduled for a barium esophagram at [facility] on 04/23/11 @ 9:00 am. Wil[facility] Arms at 8:45 am for registration. Please do not eat or drink anything 4 hours prior to your test. We have sent a refill to your pharmacy for magic mouthwash.  STOP the carafate. Continue taking Nexium 40 mg twice daily. CC: Dr Oliver Barre

## 2011-04-17 NOTE — Assessment & Plan Note (Signed)
stable overall by hx and exam, most recent data reviewed with pt, and pt to continue medical treatment as before  BP Readings from Last 3 Encounters:  04/17/11 132/70  04/17/11 128/62  04/16/11 118/82
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
## 2011-04-17 NOTE — Patient Instructions (Signed)
You had the steroid shot today Take all new medications as prescribed - the steroid pill, and the pain medication Continue all other medications as before Please go to XRAY in the Basement for the x-ray test Please call the phone number [phone] (the PhoneTree System) for results of testing in 2-3 days;  When calling, simply dial the number, and when prompted enter the MRN number above (the Medical Record Number) and the # key, then the message should start. Please call if not improved in 3-5 days for orthopedic referral to Dr Penni Bombard

## 2011-04-18 ENCOUNTER — Telehealth: Payer: Self-pay

## 2011-04-18 NOTE — Telephone Encounter (Signed)
Called patient back. The patient now has filled the Vicodin, also had Tramadol and was confused if she should be taking both pain medications. Informed the patient per MD's instructions to no longer take Tramadol, only to take Vicodin for her pain. Also, reminded to stop the prednisone as had previously instructed. The patient did agree to only take Vicodin for pain, only.

## 2011-04-18 NOTE — Telephone Encounter (Signed)
Pt left message on triage asking for return phone call regarding medications.

## 2011-04-18 NOTE — Telephone Encounter (Signed)
Called the patient to inform to stop taking the prednisone pills prescribed 04/17/2011 and ok to take 2 Vicodin if needed. The patient agreed to do so, patient has not filled prescription of Vicodin prescribed on 04/17/2011 informed would fill if needed. The shoulder pain is better today.

## 2011-04-19 ENCOUNTER — Other Ambulatory Visit: Payer: Self-pay | Admitting: Physician Assistant

## 2011-04-19 ENCOUNTER — Telehealth: Payer: Self-pay | Admitting: Internal Medicine

## 2011-04-19 NOTE — Telephone Encounter (Signed)
Called pharmacy. They could not find prescription authorization from 04/17/2011. I have given authorization over the phone for the Magic Mouthwash. Patient advised.

## 2011-04-20 ENCOUNTER — Telehealth: Payer: Self-pay | Admitting: Internal Medicine

## 2011-04-20 NOTE — Telephone Encounter (Signed)
Please resume Carafate slurry 10cc po bid, may use qid for severe burning, 3 refills of current prescription

## 2011-04-20 NOTE — Telephone Encounter (Signed)
Patient calling to report burning in her chest last night. States the burning is in her left chest, neck and shoulder. She has eaten oatmeal and feels a little better. She is using Nexium and MMW.  States the burning returned after stopping Carafate. Please, advise.

## 2011-04-23 ENCOUNTER — Ambulatory Visit (HOSPITAL_COMMUNITY)
Admission: RE | Admit: 2011-04-23 | Discharge: 2011-04-23 | Disposition: A | Discharge status: Home / Self Care | Payer: Medicare Other | Source: Ambulatory Visit | Attending: Internal Medicine | Admitting: Internal Medicine

## 2011-04-23 DIAGNOSIS — R079 Chest pain, unspecified: Secondary | ICD-10-CM | POA: Insufficient documentation

## 2011-04-23 DIAGNOSIS — Z96659 Presence of unspecified artificial knee joint: Secondary | ICD-10-CM | POA: Insufficient documentation

## 2011-04-23 NOTE — Telephone Encounter (Signed)
Late entry: Spoke with patient on Friday evening and patient states she wants to do her "test" on Monday and the burning is better. She will wait on starting the Carafate back.

## 2011-04-24 ENCOUNTER — Other Ambulatory Visit: Payer: Self-pay | Admitting: Internal Medicine

## 2011-04-24 NOTE — Progress Notes (Signed)
Discussed results with the patient. Explained that there was no need to dilate the esophagus at this time. Also advised patient to continue Nexium BID and eat sitting upright making certain to chew food very well before swallowing. She verbalizes understanding and will follow up as needed.

## 2011-05-01 ENCOUNTER — Telehealth: Payer: Self-pay | Admitting: Internal Medicine

## 2011-05-01 NOTE — Telephone Encounter (Signed)
Pt called and states that she got a call from her pharmacy that her carafate prescription was ready. Pt wanted to know if she needs to continue taking the medication, state she is still having some discomfort. Instructed pt to continue the medication if she was still having some discomfort. Pt verbalized understanding.

## 2011-05-11 ENCOUNTER — Telehealth: Payer: Self-pay | Admitting: *Deleted

## 2011-05-11 NOTE — Telephone Encounter (Signed)
Pt called to ask if paperwork was received for servicing to her wheel chair. I advised pt that was have not received forms and she stated she will call and advise of same.

## 2011-05-11 NOTE — Telephone Encounter (Signed)
Patient requesting a call back regarding parts for her wheelchair.

## 2011-05-28 ENCOUNTER — Other Ambulatory Visit: Payer: Self-pay | Admitting: Internal Medicine

## 2011-06-15 ENCOUNTER — Other Ambulatory Visit: Payer: Self-pay

## 2011-06-15 MED ORDER — HYDROCODONE-ACETAMINOPHEN 5-325 MG PO TABS: 1.0000 | ORAL_TABLET | Freq: Four times a day (QID) | ORAL | Status: DC | PRN

## 2011-06-15 NOTE — Telephone Encounter (Signed)
Faxed hardcopy to [facility] [address] [phone]

## 2011-06-18 ENCOUNTER — Telehealth: Payer: Self-pay | Admitting: *Deleted

## 2011-06-18 NOTE — Telephone Encounter (Signed)
A user error has taken place: encounter opened in error, closed for administrative reasons.

## 2011-06-18 NOTE — Telephone Encounter (Signed)
Patient left VM requesting a call back regarding med questions.

## 2011-06-19 ENCOUNTER — Telehealth: Payer: Self-pay

## 2011-06-19 NOTE — Telephone Encounter (Signed)
Pt called stating Hydrocodone is causing sever stomach upset, diarrhea and nausea. Pt has stopped taking Hydrocodone and has refill Tramadol which pt says "is much better". Pt has 2 more refills on Tramadol but is requesting Hydrocodone be removed from med list.

## 2011-06-19 NOTE — Telephone Encounter (Signed)
Pt advised.

## 2011-06-19 NOTE — Telephone Encounter (Signed)
Done  Also place hydrocodone as "allergy"

## 2011-07-13 ENCOUNTER — Ambulatory Visit: Payer: Medicare Other | Admitting: Internal Medicine

## 2011-08-22 ENCOUNTER — Ambulatory Visit: Payer: Medicare Other | Admitting: Internal Medicine

## 2011-09-10 ENCOUNTER — Other Ambulatory Visit: Payer: Self-pay | Admitting: Internal Medicine

## 2011-09-10 ENCOUNTER — Telehealth: Payer: Self-pay | Admitting: Internal Medicine

## 2011-09-10 NOTE — Telephone Encounter (Signed)
Thank You DB

## 2011-09-10 NOTE — Telephone Encounter (Signed)
Patient calling to let Dr. Juanda Chance know she has been feeling good. She is taking her Nexium BID. She wanted to let Dr. Juanda Chance know she stopped taking the Carafate last Thursday since she is feeling better.

## 2011-09-10 NOTE — Telephone Encounter (Signed)
Unable to reach patient. Phone rings but no answer or answering machine.

## 2011-09-12 ENCOUNTER — Encounter: Payer: Self-pay | Admitting: Internal Medicine

## 2011-09-12 ENCOUNTER — Ambulatory Visit (INDEPENDENT_AMBULATORY_CARE_PROVIDER_SITE_OTHER): Payer: Medicare Other | Admitting: Internal Medicine

## 2011-09-12 VITALS — BP 120/68 | HR 69 | Temp 97.9°F | Ht 61.0 in | Wt 123.5 lb

## 2011-09-12 DIAGNOSIS — M25512 Pain in left shoulder: Secondary | ICD-10-CM

## 2011-09-12 DIAGNOSIS — R110 Nausea: Secondary | ICD-10-CM

## 2011-09-12 DIAGNOSIS — J45909 Unspecified asthma, uncomplicated: Secondary | ICD-10-CM

## 2011-09-12 DIAGNOSIS — I10 Essential (primary) hypertension: Secondary | ICD-10-CM

## 2011-09-12 DIAGNOSIS — M25519 Pain in unspecified shoulder: Secondary | ICD-10-CM

## 2011-09-12 DIAGNOSIS — Z23 Encounter for immunization: Secondary | ICD-10-CM

## 2011-09-12 MED ORDER — ONDANSETRON HCL 4 MG PO TABS: 4.0000 mg | ORAL_TABLET | Freq: Two times a day (BID) | ORAL | Status: AC | PRN

## 2011-09-12 NOTE — Patient Instructions (Signed)
You had the flu shot today No need for any med changes or lab work today - Continue all other medications as before Please return in 6 months, or sooner if needed

## 2011-09-12 NOTE — Progress Notes (Signed)
Subjective:    Patient ID: Robin Arroyo, female    DOB: 1919-07-12, 77 y.o.   MRN: [id number]  HPI Here to f/u; overall doing ok,  Pt denies chest pain, increased sob or doe, wheezing, orthopnea, PND, increased LE swelling, palpitations, dizziness or syncope.  Pt denies new neurological symptoms such as new headache, or facial or extremity weakness or numbness   Pt denies polydipsia, polyuria, or low sugar symptoms such as weakness or confusion improved with po intake.  Pt states overall good compliance with meds, trying to follow lower cholesterol diet, wt overall stable but little exercise however.   Despite xray c/w acromion fx June 2012, pt saw ortho and told no fx, but had severe left rot cuff deg changes, no surgury, and pain controlled with tramadol.    Has lost a few lbs with some difficulty eating, sees Dr Marlowe Shores - now on nexium bid, eats 4-5 times per day to avoid worsening burning discomfort, and good compliance with diet which also helps.  Denies worsening  abd pain, vomiting, bowel change or blood.  Does ask for med for nausea prn.     Sleeping is better recently.    Walks with walker at home, bue does not try outside the home due to her knees.  No recent falls.  Due for flu shot today Past Medical History  Diagnosis Date  . Unspecified vitamin D deficiency 07/20/2009  . HYPERLIPIDEMIA 12/11/2007  . ANXIETY 12/11/2007  . INSOMNIA-SLEEP DISORDER-UNSPEC 12/11/2007  . DEPRESSION 12/11/2007  . HYPERTENSION 12/11/2007  . ALLERGIC RHINITIS 12/11/2007  . ASTHMA 12/11/2007  . GERD 12/11/2007  . HIATAL HERNIA 08/17/2010  . DIVERTICULOSIS, COLON 12/11/2007  . ARTHRITIS, RHEUMATOID 12/11/2007  . OSTEOARTHRITIS, KNEES, BILATERAL 01/18/2010  . OSTEOPOROSIS 12/11/2007  . INSOMNIA-SLEEP DISORDER-UNSPEC 06/08/2008  . FATIGUE 12/11/2007  . SKIN RASH 05/11/2008  . CONTUSION, LOWER LEG, RIGHT 01/19/2009  . COLONIC POLYPS, HYPERPLASTIC, HX OF 08/17/2010  . DERMATITIS FACTITIA 01/10/2011  . PRURITUS 01/10/2011  . CHEST  PAIN 01/15/2011  . Single kidney     functional   . Lumbar spine pain     DJD  . Hearing loss     hearing aids  . Vitamin D deficiency   . Acromial fracture 04/17/2011   Past Surgical History  Procedure Date  . Abdominal hysterectomy   . Cholecystectomy   . S/p bilateral knee replacements   . S/p bladder surgery   . Appendectomy   . Tonsillectomy     reports that she has quit smoking. She does not have any smokeless tobacco history on file. She reports that she does not drink alcohol or use illicit drugs. family history includes Breast cancer in her mother and sister; Cancer in her father; Diabetes in her sister; Prostate cancer in her brothers and father; and Stomach cancer in her sister.  There is no history of Colon cancer. Allergies  Allergen Reactions  . Alendronate Sodium     REACTION: constipation  . Citalopram Hydrobromide   . Hydrocodone Nausea Only  . Ibandronate Sodium     REACTION: gi upset  . Mirtazapine    Current Outpatient Prescriptions on File Prior to Visit  Medication Sig Dispense Refill  . Calcium Carbonate Antacid (TUMS PO) Take by mouth 3 (three) times daily as needed.        . Cholecalciferol (VITAMIN D) 1000 UNITS capsule Take 1,000 Units by mouth daily.        . clotrimazole-betamethasone (LOTRISONE) cream Apply topically 2 (two)  times daily.  30 g  1  . esomeprazole (NEXIUM) 40 MG capsule Take 1 capsule (40 mg total) by mouth 2 (two) times daily.  60 capsule  11  . fluticasone (FLOVENT HFA) 44 MCG/ACT inhaler Inhale 2 puffs into the lungs 2 (two) times daily.        Marland Kitchen levalbuterol (XOPENEX) 1.25 MG/3ML nebulizer solution Take 1 ampule by nebulization every 4 (four) hours as needed.        Marland Kitchen levocetirizine (XYZAL) 5 MG tablet Take 5 mg by mouth daily.        Marland Kitchen lisinopril (PRINIVIL,ZESTRIL) 20 MG tablet TAKE 1 TABLET EVERY DAY  30 tablet  11  . ondansetron (ZOFRAN) 4 MG tablet Take 1/2-1 tab every 8 hours prn nausea  15 tablet  1  . polyethylene glycol  (MIRALAX) powder Take 17 g by mouth. 17 gm in water by mouth once daily as needed      . sertraline (ZOLOFT) 50 MG tablet Take 1 tablet (50 mg total) by mouth daily.  30 tablet  2  . traMADol (ULTRAM) 50 MG tablet TAKE 1 TABLET BY MOUTH EVERY 6 HOURS AS NEEDED PAIN  60 tablet  3   Review of Systems Review of Systems  Constitutional: Negative for diaphoresis and unexpected weight change.  HENT: Negative for drooling and tinnitus.   Eyes: Negative for photophobia and visual disturbance.  Respiratory: Negative for choking and stridor.   Gastrointestinal: Negative for vomiting and blood in stool.  Genitourinary: Negative for hematuria and decreased urine volume.       Objective:   Physical Exam BP 120/68  Pulse 69  Temp(Src) 97.9 F (36.6 C) (Oral)  Ht 5\' 1"  (1.549 m)  Wt 123 lb 8 oz (56.019 kg)  BMI 23.34 kg/m2  SpO2 96% Physical Exam  VS noted Constitutional: Pt appears well-developed and well-nourished.  HENT: Head: Normocephalic.  Right Ear: External ear normal.  Left Ear: External ear normal.  Eyes: Conjunctivae and EOM are normal. Pupils are equal, round, and reactive to light.  Neck: Normal range of motion. Neck supple.  Cardiovascular: Normal rate and regular rhythm.   Pulmonary/Chest: Effort normal and breath sounds normal.  Abd:  Soft, NT, non-distended, + BS Neurological: Pt is alert. No cranial nerve deficit.  Skin: Skin is warm. No erythema.  Psychiatric: Pt behavior is normal. Thought content normal. 1+ nervous Left ankle with chronic DJD/deformity/swelling after hx of ankle fx    Assessment & Plan:

## 2011-09-16 ENCOUNTER — Encounter: Payer: Self-pay | Admitting: Internal Medicine

## 2011-09-16 NOTE — Assessment & Plan Note (Signed)
No recnet fx per ortho; Continue all other medications as before, f/u ortho as planned

## 2011-09-16 NOTE — Assessment & Plan Note (Signed)
For prn zofran,  to f/u any worsening symptoms or concerns, also with GI/dr brodie as planned

## 2011-09-16 NOTE — Assessment & Plan Note (Signed)
stable overall by hx and exam, most recent data reviewed with pt, and pt to continue medical treatment as before  SpO2 Readings from Last 3 Encounters:  09/12/11 96%  04/17/11 95%  04/16/11 96%

## 2011-09-16 NOTE — Assessment & Plan Note (Signed)
stable overall by hx and exam, most recent data reviewed with pt, and pt to continue medical treatment as before  BP Readings from Last 3 Encounters:  09/12/11 120/68  04/17/11 132/70  04/17/11 128/62

## 2011-11-12 IMAGING — CT CT CERVICAL SPINE W/O CM
4 of 5 series · 16 of 33 positions shown, 18 images · non-contrast
Comparison: None.

CT HEAD

CLINICAL DATA: Fell.  Hit head.

CT HEAD WITHOUT CONTRAST
CT CERVICAL SPINE WITHOUT CONTRAST
TECHNIQUE: Multidetector CT imaging of the head and cervical spine
was performed following the standard protocol without intravenous
contrast.  Multiplanar CT image reconstructions of the cervical
spine were also generated.

[Series 4: c_spine 2.0 b31s · axial · 0.25mm/px · z∈[-218,-132]mm · 4 of 73 slices shown, 5 images]
[im 15/73  soft-tissue]
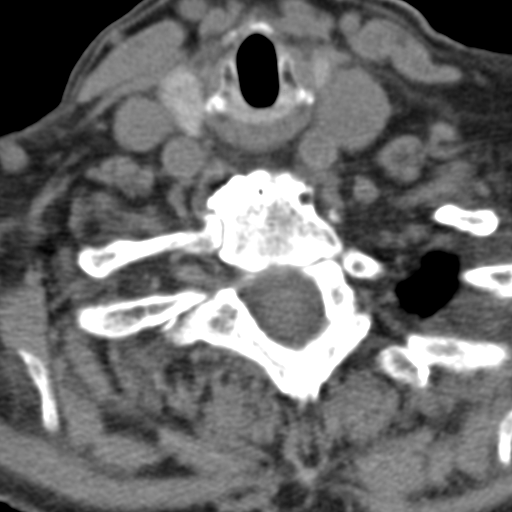
[im 15/73  bone]
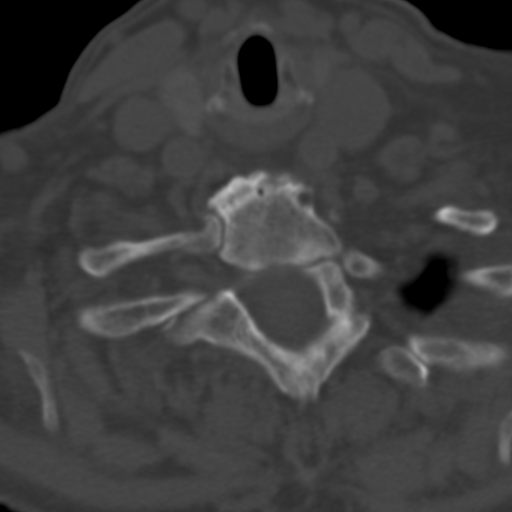
[im 29/73  bone]
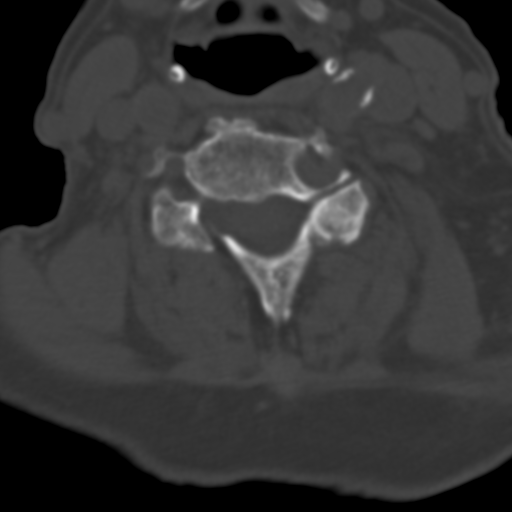
[im 44/73  bone]
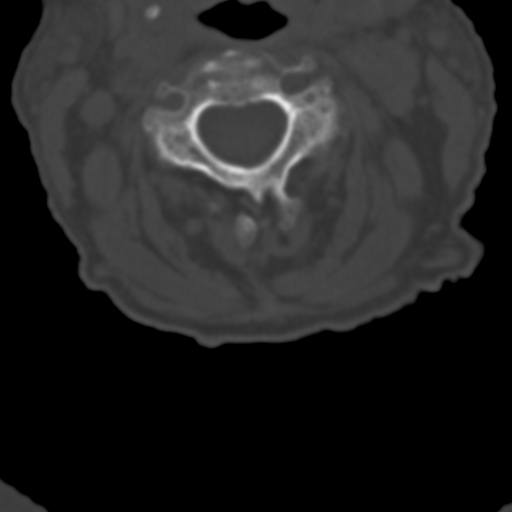
[im 58/73  bone]
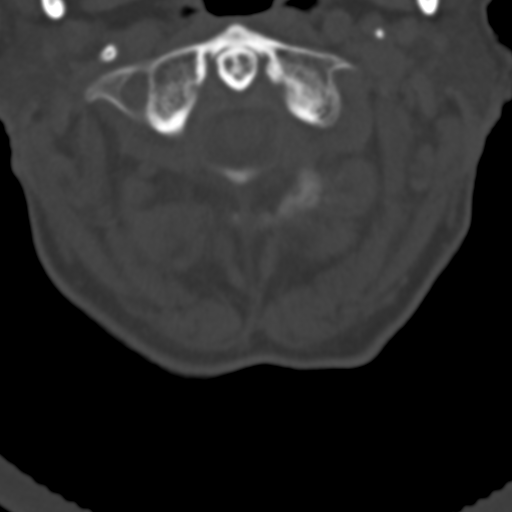

[Series 602: axial · axial · 0.29mm/px · z∈[-246,-166]mm · 4 of 74 slices shown]
[im 15/74  bone]
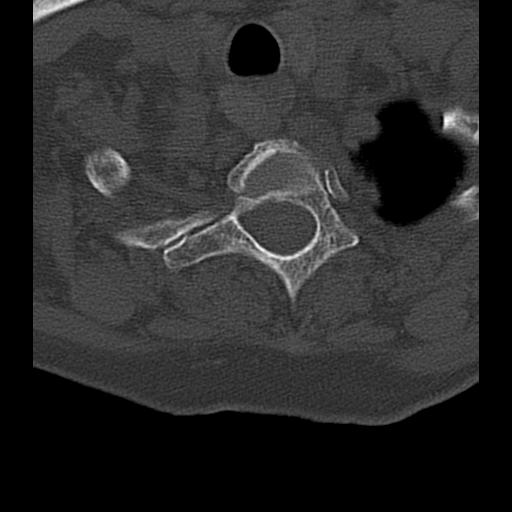
[im 30/74  bone]
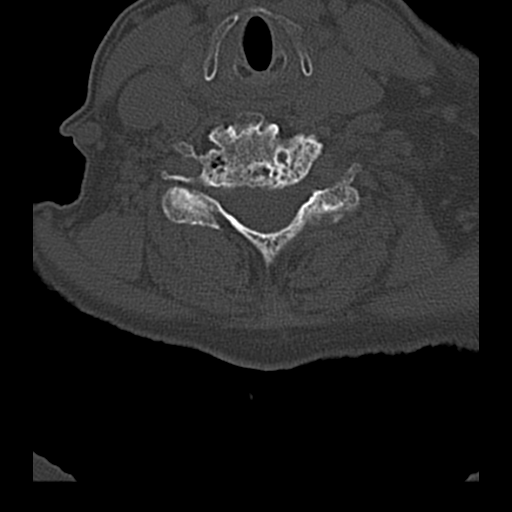
[im 44/74  bone]
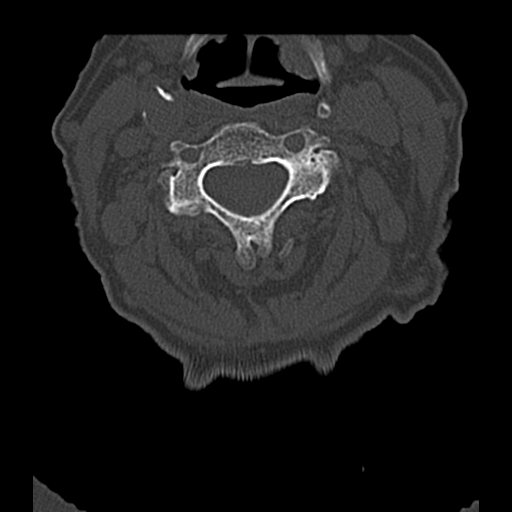
[im 59/74  bone]
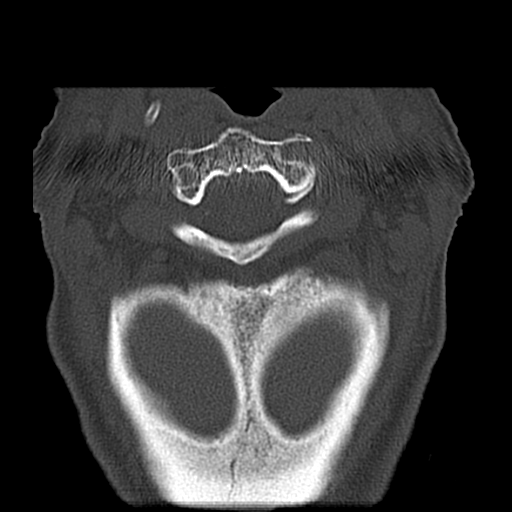

[Series 603: coronal · coronal · 0.29mm/px · 3 of 34 slices shown]
[im 7/34  bone]
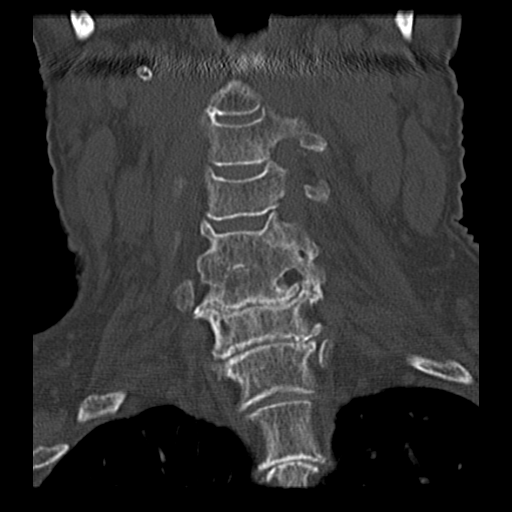
[im 14/34  bone]
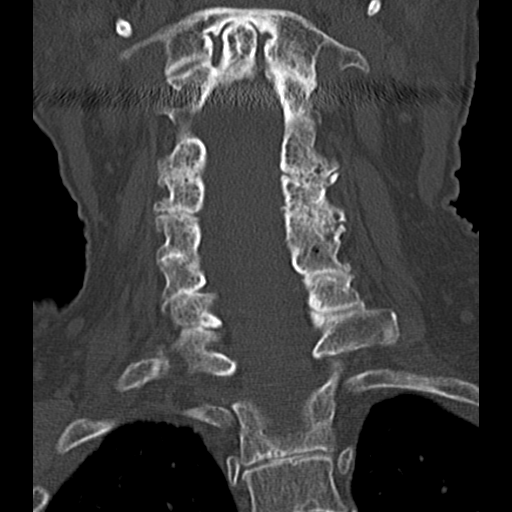
[im 20/34  bone]
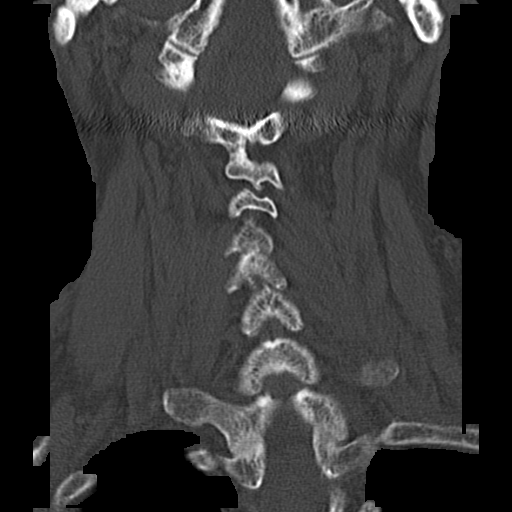

[Series 604: sagittqal · sagittal · 0.29mm/px · 5 of 41 slices shown, 6 images]
[im 14/41  bone]
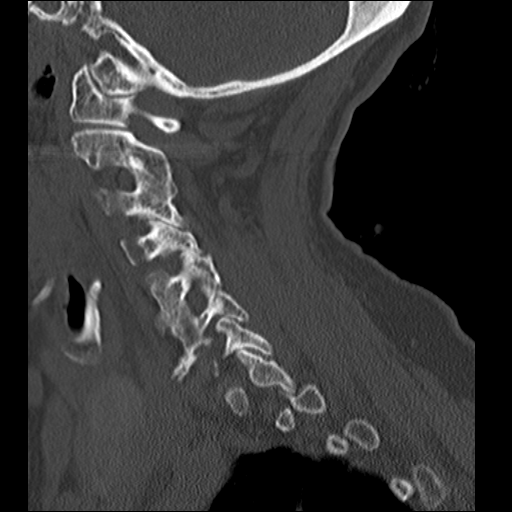
[im 17/41  bone]
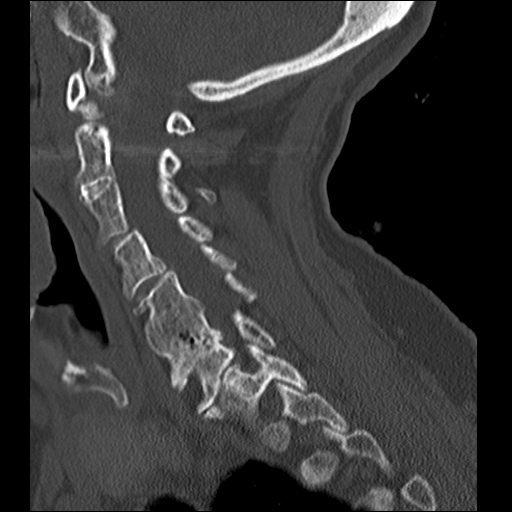
[im 21/41  soft-tissue]
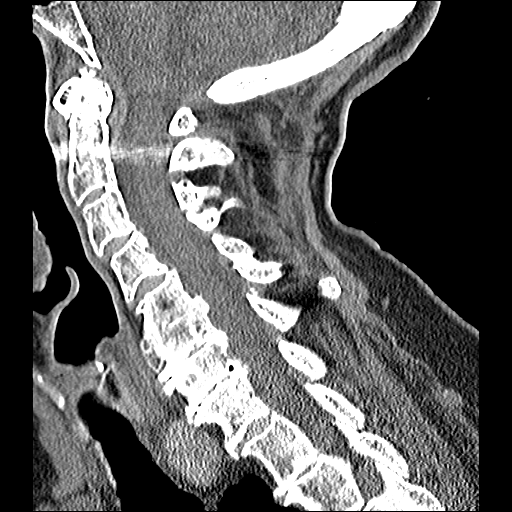
[im 21/41  bone]
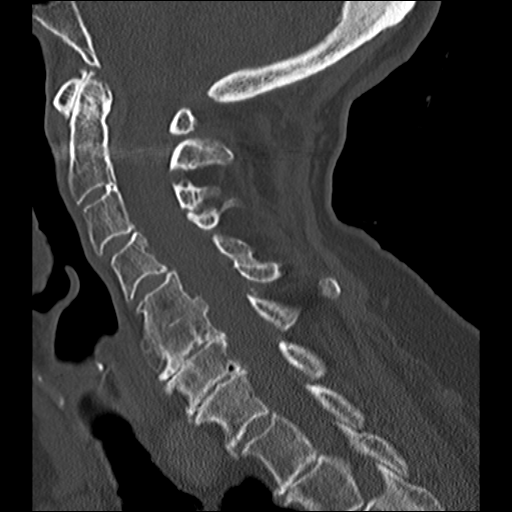
[im 24/41  bone]
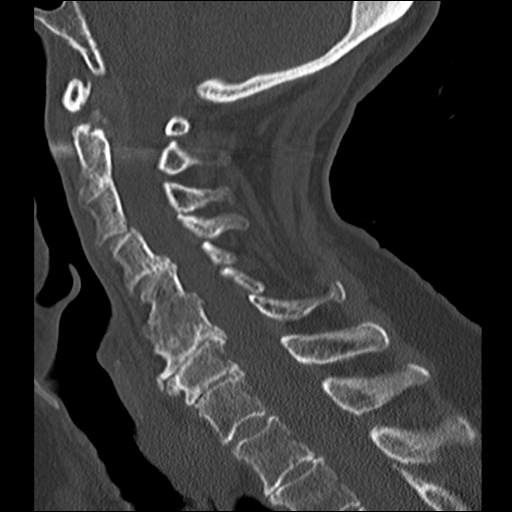
[im 27/41  bone]
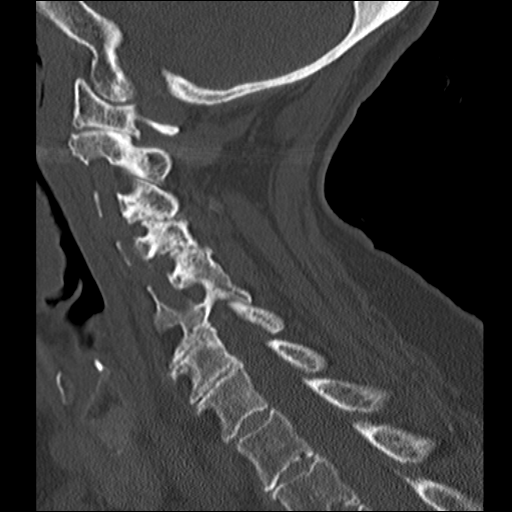

[16 of 33 positions shown; findings below may reference images not displayed]

FINDINGS: There is mild age related cerebral atrophy,
ventriculomegaly and periventricular white matter disease.  No
extra-axial fluid collections.  No CT findings for acute
hemispheric infarction or intracranial hemorrhage.  The brainstem
and cerebellum appear normal.  No mass lesions.

The bony calvarium is intact.  No acute skull fracture.  Mild
hyperostosis frontalis interna.  The
IMPRESSION: 1.  Mild age related cerebral atrophy, ventriculomegaly and
periventricular white matter disease.
2.  No acute intracranial findings or acute skull fracture.

CT CERVICAL SPINE
FINDINGS: There is severe degenerative cervical spondylosis with
disc disease and facet disease.  Interbody fusion at C5-6 is noted.
The C3-4 and C4-5 facets are fused on the left.  The C2-3 facets
are fused on the right.  No acute fracture. The C1-2 articulations
are maintained.  The dens appears normal.  No abnormal prevertebral
soft tissue swelling and no significant canal compromise.  The lung
apices are clear.
IMPRESSION: 1.  Severe degenerative cervical spondylosis with disc disease and
facet disease.
2.  No acute bony findings.

## 2012-03-07 ENCOUNTER — Other Ambulatory Visit: Payer: Self-pay | Admitting: Internal Medicine

## 2012-03-12 ENCOUNTER — Ambulatory Visit (INDEPENDENT_AMBULATORY_CARE_PROVIDER_SITE_OTHER): Payer: Medicare Other | Admitting: Internal Medicine

## 2012-03-12 ENCOUNTER — Other Ambulatory Visit (INDEPENDENT_AMBULATORY_CARE_PROVIDER_SITE_OTHER): Payer: Medicare Other

## 2012-03-12 ENCOUNTER — Encounter: Payer: Self-pay | Admitting: Internal Medicine

## 2012-03-12 VITALS — BP 154/72 | HR 68 | Temp 97.0°F | Ht 60.0 in | Wt 124.0 lb

## 2012-03-12 DIAGNOSIS — I10 Essential (primary) hypertension: Secondary | ICD-10-CM

## 2012-03-12 DIAGNOSIS — F329 Major depressive disorder, single episode, unspecified: Secondary | ICD-10-CM

## 2012-03-12 DIAGNOSIS — E785 Hyperlipidemia, unspecified: Secondary | ICD-10-CM

## 2012-03-12 DIAGNOSIS — K219 Gastro-esophageal reflux disease without esophagitis: Secondary | ICD-10-CM

## 2012-03-12 LAB — LIPID PANEL
HDL: 67.5 mg/dL (ref >39.00)
Total CHOL/HDL Ratio: 2
Triglycerides: 63 mg/dL (ref 0.0–149.0)
VLDL: 12.6 mg/dL (ref 0.0–40.0)

## 2012-03-12 LAB — BASIC METABOLIC PANEL
BUN: 20 mg/dL (ref 6–23)
Creatinine, Ser: 0.8 mg/dL (ref 0.4–1.2)
GFR: 68.5 mL/min (ref >60.00)
Glucose, Bld: 87 mg/dL (ref 70–99)
Potassium: 4.7 mEq/L (ref 3.5–5.1)

## 2012-03-12 LAB — CBC WITH DIFFERENTIAL/PLATELET
Basophils Relative: 0.7 % (ref 0.0–3.0)
Eosinophils Relative: 2.1 % (ref 0.0–5.0)
HCT: 34.4 % — ABNORMAL LOW (ref 36.0–46.0)
Lymphs Abs: 1.8 10*3/uL (ref 0.7–4.0)
Monocytes Relative: 9.9 % (ref 3.0–12.0)
Neutrophils Relative %: 54.3 % (ref 43.0–77.0)
Platelets: 220 10*3/uL (ref 150.0–400.0)
RBC: 3.67 Mil/uL — ABNORMAL LOW (ref 3.87–5.11)
WBC: 5.5 10*3/uL (ref 4.5–10.5)

## 2012-03-12 LAB — HEPATIC FUNCTION PANEL
AST: 17 U/L (ref 0–37)
Albumin: 3.5 g/dL (ref 3.5–5.2)
Total Bilirubin: 0.8 mg/dL (ref 0.3–1.2)

## 2012-03-12 LAB — TSH: TSH: 1.79 u[IU]/mL (ref 0.35–5.50)

## 2012-03-12 MED ORDER — ESOMEPRAZOLE MAGNESIUM 40 MG PO CPDR: 40.0000 mg | DELAYED_RELEASE_CAPSULE | Freq: Every day | ORAL | Status: DC

## 2012-03-12 NOTE — Patient Instructions (Addendum)
Please decrease the nexium to once per day only Continue all other medications as before Please have the pharmacy call with any refills you may need. Please go to LAB in the Basement for the blood and/or urine tests to be done today You will be contacted by phone if any changes need to be made immediately.  Otherwise, you will receive a letter about your results with an explanation.

## 2012-03-22 ENCOUNTER — Encounter: Payer: Self-pay | Admitting: Internal Medicine

## 2012-03-22 NOTE — Assessment & Plan Note (Signed)
stable overall by hx and exam, most recent data reviewed with pt, and pt to continue medical treatment as before Lab Results  Component Value Date   WBC 5.5 03/12/2012   HGB 11.6* 03/12/2012   HCT 34.4* 03/12/2012   PLT 220.0 03/12/2012   GLUCOSE 87 03/12/2012   CHOL 142 03/12/2012   TRIG 63.0 03/12/2012   HDL 67.50 03/12/2012   LDLDIRECT 114.6 12/11/2007   LDLCALC 62 03/12/2012   ALT 10 03/12/2012   AST 17 03/12/2012   NA 140 03/12/2012   K 4.7 03/12/2012   CL 102 03/12/2012   CREATININE 0.8 03/12/2012   BUN 20 03/12/2012   CO2 31 03/12/2012   TSH 1.79 03/12/2012    

## 2012-03-22 NOTE — Progress Notes (Signed)
Subjective:    Patient ID: Robin Arroyo, female    DOB: 1919-04-08, 78 y.o.   MRN: [id number]  HPI here to f/u; overall doing ok, tries to walk daily most days despite end stage knee OA, and pain with left shoudler rot cuff chronic tear.  Has ongoing chronic hoarseness, sees Dr Erie Callas for allergies and asthma, wants to try once dialy PPI to see of reflux can still be controlled as BID is expensive, constipation overall stable with 3x wkly miralax, wt loss stable over the past 6 months, Denies worsening depressive symptoms, suicidal ideation, or panic, though has ongoing anxiety, not increased recently.   Pt denies chest pain, increased sob or doe, wheezing, orthopnea, PND, increased LE swelling, palpitations, dizziness or syncope.  Pt denies new neurological symptoms such as new headache, or facial or extremity weakness or numbness   Pt denies polydipsia, polyuria,  Past Medical History  Diagnosis Date  . Unspecified vitamin D deficiency 07/20/2009  . HYPERLIPIDEMIA 12/11/2007  . ANXIETY 12/11/2007  . INSOMNIA-SLEEP DISORDER-UNSPEC 12/11/2007  . DEPRESSION 12/11/2007  . HYPERTENSION 12/11/2007  . ALLERGIC RHINITIS 12/11/2007  . ASTHMA 12/11/2007  . GERD 12/11/2007  . HIATAL HERNIA 08/17/2010  . DIVERTICULOSIS, COLON 12/11/2007  . ARTHRITIS, RHEUMATOID 12/11/2007  . OSTEOARTHRITIS, KNEES, BILATERAL 01/18/2010  . OSTEOPOROSIS 12/11/2007  . INSOMNIA-SLEEP DISORDER-UNSPEC 06/08/2008  . FATIGUE 12/11/2007  . SKIN RASH 05/11/2008  . CONTUSION, LOWER LEG, RIGHT 01/19/2009  . COLONIC POLYPS, HYPERPLASTIC, HX OF 08/17/2010  . DERMATITIS FACTITIA 01/10/2011  . PRURITUS 01/10/2011  . CHEST PAIN 01/15/2011  . Single kidney     functional   . Lumbar spine pain     DJD  . Hearing loss     hearing aids  . Vitamin d deficiency   . Acromial fracture 04/17/2011   Past Surgical History  Procedure Date  . Abdominal hysterectomy   . Cholecystectomy   . S/p bilateral knee replacements   . S/p bladder surgery   . Appendectomy    . Tonsillectomy     reports that she has quit smoking. She does not have any smokeless tobacco history on file. She reports that she does not drink alcohol or use illicit drugs. family history includes Breast cancer in her mother and sister; Cancer in her father; Diabetes in her sister; Prostate cancer in her brothers and father; and Stomach cancer in her sister.  There is no history of Colon cancer. Allergies  Allergen Reactions  . Alendronate Sodium     REACTION: constipation  . Citalopram Hydrobromide   . Hydrocodone Nausea Only  . Ibandronate Sodium     REACTION: gi upset  . Mirtazapine    Current Outpatient Prescriptions on File Prior to Visit  Medication Sig Dispense Refill  . Calcium Carbonate Antacid (TUMS PO) Take by mouth 3 (three) times daily as needed.        . Cholecalciferol (VITAMIN D) 1000 UNITS capsule Take 1,000 Units by mouth daily.        . clotrimazole-betamethasone (LOTRISONE) cream Apply topically 2 (two) times daily.  30 g  1  . esomeprazole (NEXIUM) 40 MG capsule Take 1 capsule (40 mg total) by mouth daily.  30 capsule  11  . fluticasone (FLOVENT HFA) 44 MCG/ACT inhaler Inhale 2 puffs into the lungs 2 (two) times daily.        Marland Kitchen levalbuterol (XOPENEX) 1.25 MG/3ML nebulizer solution Take 1 ampule by nebulization every 4 (four) hours as needed.        Marland Kitchen  levocetirizine (XYZAL) 5 MG tablet Take 5 mg by mouth daily.        Marland Kitchen lisinopril (PRINIVIL,ZESTRIL) 20 MG tablet TAKE 1 TABLET EVERY DAY  30 tablet  11  . ondansetron (ZOFRAN) 4 MG tablet Take 1/2-1 tab every 8 hours prn nausea  15 tablet  1  . ondansetron (ZOFRAN) 4 MG tablet Take 1 tablet (4 mg total) by mouth every 12 (twelve) hours as needed for nausea.  30 tablet  5  . polyethylene glycol (MIRALAX) powder Take 17 g by mouth. 17 gm in water by mouth once daily as needed      . sertraline (ZOLOFT) 50 MG tablet TAKE 1 TABLET EVERY DAY  30 tablet  11  . traMADol (ULTRAM) 50 MG tablet TAKE 1 TABLET BY MOUTH EVERY 6  HOURS AS NEEDED PAIN  60 tablet  3    Review of Systems Review of Systems  Constitutional: Negative for diaphoresis and unexpected weight change.  Eyes: Negative for photophobia and visual disturbance.  Respiratory: Negative for choking and stridor.   Gastrointestinal: Negative for vomiting and blood in stool.  Genitourinary: Negative for hematuria and decreased urine volume.  Musculoskeletal: Negative for gait problem.  Skin: Negative for color change and wound.  Neurological: Negative for tremors and numbness.  Psychiatric/Behavioral: Negative for decreased concentration. The patient is not hyperactive.      Objective:   Physical Exam BP 154/72  Pulse 68  Temp(Src) 97 F (36.1 C) (Oral)  Ht 5' (1.524 m)  Wt 124 lb (56.246 kg)  BMI 24.22 kg/m2  SpO2 97% Physical Exam  VS noted, elderly, frail Constitutional: Pt appears well-developed and well-nourished.  HENT: Head: Normocephalic.  Right Ear: External ear normal.  Left Ear: External ear normal.  Eyes: Conjunctivae and EOM are normal. Pupils are equal, round, and reactive to light.  Neck: Normal range of motion. Neck supple.  Cardiovascular: Normal rate and regular rhythm.   Pulmonary/Chest: Effort normal and breath sounds normal.  Abd:  Soft, NT, non-distended, + BS Neurological: Pt is alert. Not confused Skin: Skin is warm. No erythema.  Psychiatric: Pt behavior is normal. Thought content normal. 1+ nervous    Assessment & Plan:

## 2012-03-22 NOTE — Assessment & Plan Note (Signed)
stable overall by hx and exam, most recent data reviewed with pt, and pt to continue medical treatment as before Lab Results  Component Value Date   LDLCALC 62 03/12/2012    

## 2012-03-22 NOTE — Assessment & Plan Note (Signed)
stable overall by hx and exam, most recent data reviewed with pt, and pt to continue medical treatment as before BP Readings from Last 3 Encounters:  03/12/12 154/72  09/12/11 120/68  04/17/11 132/70

## 2012-03-22 NOTE — Assessment & Plan Note (Signed)
To try once dialy PPI,  to f/u any worsening symptoms or concerns

## 2012-03-28 ENCOUNTER — Other Ambulatory Visit: Payer: Self-pay | Admitting: Internal Medicine

## 2012-03-29 ENCOUNTER — Other Ambulatory Visit: Payer: Self-pay | Admitting: Internal Medicine

## 2012-04-02 ENCOUNTER — Other Ambulatory Visit: Payer: Self-pay | Admitting: Internal Medicine

## 2012-04-23 ENCOUNTER — Telehealth: Payer: Self-pay | Admitting: *Deleted

## 2012-04-23 NOTE — Telephone Encounter (Signed)
Pt has questions about a bill she received and is requesting a callback-gave pt number to [employer]-and also transferred pt to billing.

## 2012-05-19 ENCOUNTER — Other Ambulatory Visit: Payer: Self-pay | Admitting: Internal Medicine

## 2012-07-16 ENCOUNTER — Other Ambulatory Visit: Payer: Self-pay | Admitting: Internal Medicine

## 2012-07-29 ENCOUNTER — Other Ambulatory Visit: Payer: Self-pay | Admitting: Internal Medicine

## 2012-09-16 ENCOUNTER — Ambulatory Visit (INDEPENDENT_AMBULATORY_CARE_PROVIDER_SITE_OTHER): Payer: Medicare Other | Admitting: Internal Medicine

## 2012-09-16 ENCOUNTER — Encounter: Payer: Self-pay | Admitting: Internal Medicine

## 2012-09-16 VITALS — BP 130/70 | HR 61 | Temp 97.3°F | Ht 60.0 in | Wt 123.0 lb

## 2012-09-16 DIAGNOSIS — I10 Essential (primary) hypertension: Secondary | ICD-10-CM

## 2012-09-16 DIAGNOSIS — E785 Hyperlipidemia, unspecified: Secondary | ICD-10-CM

## 2012-09-16 DIAGNOSIS — Z23 Encounter for immunization: Secondary | ICD-10-CM

## 2012-09-16 DIAGNOSIS — F3289 Other specified depressive episodes: Secondary | ICD-10-CM

## 2012-09-16 DIAGNOSIS — F329 Major depressive disorder, single episode, unspecified: Secondary | ICD-10-CM

## 2012-09-16 NOTE — Progress Notes (Signed)
Subjective:    Patient ID: Robin Arroyo, female    DOB: 1919/01/17, 78 y.o.   MRN: [id number]  HPI  Here to f/u; overall doing ok,  Pt denies chest pain, increased sob or doe, wheezing, orthopnea, PND, increased LE swelling, palpitations, dizziness or syncope.  Pt denies new neurological symptoms such as new headache, or facial or extremity weakness or numbness   Pt denies polydipsia, polyuria, or low sugar symptoms such as weakness or confusion improved with po intake.  Pt states overall good compliance with meds, trying to follow lower cholesterol, diabetic diet, wt overall stable but little exercise however. Denies worsening depressive symptoms, suicidal ideation, or panic, though has ongoing anxiety, not increased recently.  Has ongoing esoph dysmotility issue, wt now down to 123 but she is unconcerned as has been stable for several months.  No n/v.  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  Non operative knee candidate due to age and risk.  Chornic constipation stable with miralax 3 times per wk Past Medical History  Diagnosis Date  . Unspecified vitamin D deficiency 07/20/2009  . HYPERLIPIDEMIA 12/11/2007  . ANXIETY 12/11/2007  . INSOMNIA-SLEEP DISORDER-UNSPEC 12/11/2007  . DEPRESSION 12/11/2007  . HYPERTENSION 12/11/2007  . ALLERGIC RHINITIS 12/11/2007  . ASTHMA 12/11/2007  . GERD 12/11/2007  . HIATAL HERNIA 08/17/2010  . DIVERTICULOSIS, COLON 12/11/2007  . ARTHRITIS, RHEUMATOID 12/11/2007  . OSTEOARTHRITIS, KNEES, BILATERAL 01/18/2010  . OSTEOPOROSIS 12/11/2007  . INSOMNIA-SLEEP DISORDER-UNSPEC 06/08/2008  . FATIGUE 12/11/2007  . SKIN RASH 05/11/2008  . CONTUSION, LOWER LEG, RIGHT 01/19/2009  . COLONIC POLYPS, HYPERPLASTIC, HX OF 08/17/2010  . DERMATITIS FACTITIA 01/10/2011  . PRURITUS 01/10/2011  . CHEST PAIN 01/15/2011  . Single kidney     functional   . Lumbar spine pain     DJD  . Hearing loss     hearing aids  . Vitamin D deficiency   . Acromial  fracture 04/17/2011   Past Surgical History  Procedure Date  . Abdominal hysterectomy   . Cholecystectomy   . S/p bilateral knee replacements   . S/p bladder surgery   . Appendectomy   . Tonsillectomy     reports that she has quit smoking. She does not have any smokeless tobacco history on file. She reports that she does not drink alcohol or use illicit drugs. family history includes Breast cancer in her mother and sister; Cancer in her father; Diabetes in her sister; Prostate cancer in her brothers and father; and Stomach cancer in her sister.  There is no history of Colon cancer. Allergies  Allergen Reactions  . Alendronate Sodium     REACTION: constipation  . Citalopram Hydrobromide   . Hydrocodone Nausea Only  . Ibandronate Sodium     REACTION: gi upset  . Mirtazapine    Current Outpatient Prescriptions on File Prior to Visit  Medication Sig Dispense Refill  . Calcium Carbonate Antacid (TUMS PO) Take by mouth 3 (three) times daily as needed.        . Cholecalciferol (VITAMIN D) 1000 UNITS capsule Take 1,000 Units by mouth daily.        . clotrimazole-betamethasone (LOTRISONE) cream APPLY TOPICALLY TWICE DAILY  30 g  1  . fluticasone (FLOVENT HFA) 44 MCG/ACT inhaler Inhale 2 puffs into the lungs 2 (two) times daily.        Marland Kitchen levalbuterol (XOPENEX) 1.25 MG/3ML nebulizer solution Take 1 ampule by nebulization every 4 (four) hours as  needed.        Marland Kitchen levocetirizine (XYZAL) 5 MG tablet Take 5 mg by mouth daily.        Marland Kitchen lisinopril (PRINIVIL,ZESTRIL) 20 MG tablet TAKE 1 TABLET EVERY DAY  30 tablet  11  . NEXIUM 40 MG capsule TAKE ONE CAPSULE TWICE A DAY  60 capsule  11  . ondansetron (ZOFRAN) 4 MG tablet Take 1/2-1 tab every 8 hours prn nausea  15 tablet  1  . polyethylene glycol (MIRALAX) powder Take 17 g by mouth. 17 gm in water by mouth once daily as needed      . sertraline (ZOLOFT) 50 MG tablet TAKE 1 TABLET EVERY DAY  30 tablet  11  . traMADol (ULTRAM) 50 MG tablet TAKE 1  TABLET BY MOUTH EVERY 6 HOURS AS NEEDED FOR PAIN  60 tablet  3  . traMADol (ULTRAM) 50 MG tablet TAKE 1 TABLET BY MOUTH EVERY 6 HOURS AS NEEDED FOR PAIN  60 tablet  3  . triamcinolone cream (KENALOG) 0.1 % APPLY TO AFFECTED AREA TWICE A DAY  80 g  1   Review of Systems  Constitutional: Negative for diaphoresis and unexpected weight change.  HENT: Negative for tinnitus.   Eyes: Negative for photophobia and visual disturbance.  Respiratory: Negative for choking and stridor.   Gastrointestinal: Negative for vomiting and blood in stool.  Genitourinary: Negative for hematuria and decreased urine volume.  Musculoskeletal: Negative for gait problem.  Skin: Negative for color change and wound.  Neurological: Negative for tremors and numbness.  Psychiatric/Behavioral: Negative for decreased concentration. The patient is not hyperactive.       Objective:   Physical Exam BP 130/70  Pulse 61  Temp 97.3 F (36.3 C) (Oral)  Ht 5' (1.524 m)  Wt 123 lb (55.792 kg)  BMI 24.02 kg/m2  SpO2 94% Physical Exam  VS noted Constitutional: Pt appears well-developed and well-nourished.  HENT: Head: Normocephalic.  Right Ear: External ear normal.  Left Ear: External ear normal.  Eyes: Conjunctivae and EOM are normal. Pupils are equal, round, and reactive to light.  Neck: Normal range of motion. Neck supple.  Cardiovascular: Normal rate and regular rhythm.   Pulmonary/Chest: Effort normal and breath sounds normal.  Abd:  Soft, NT, non-distended, + BS Neurological: Pt is alert. Not confused  Skin: Skin is warm. No erythema.  Psychiatric: Pt behavior is normal. Thought content normal. not depressed affect    Assessment & Plan:

## 2012-09-16 NOTE — Patient Instructions (Addendum)
Continue all other medications as before, including the miralax 3 times per week No medicine changes today No need for further blood work it seems today Please have the pharmacy call with any refills you may need. You had the flu shot today You may want to try Ensure or Boost to avoid further weight loss Please return in 6 mo with Lab testing done 3-5 days before

## 2012-09-16 NOTE — Assessment & Plan Note (Signed)
stable overall by hx and exam, most recent data reviewed with pt, and pt to continue medical treatment as before Lab Results  Component Value Date   WBC 5.5 03/12/2012   HGB 11.6* 03/12/2012   HCT 34.4* 03/12/2012   PLT 220.0 03/12/2012   GLUCOSE 87 03/12/2012   CHOL 142 03/12/2012   TRIG 63.0 03/12/2012   HDL 67.50 03/12/2012   LDLDIRECT 114.6 12/11/2007   LDLCALC 62 03/12/2012   ALT 10 03/12/2012   AST 17 03/12/2012   NA 140 03/12/2012   K 4.7 03/12/2012   CL 102 03/12/2012   CREATININE 0.8 03/12/2012   BUN 20 03/12/2012   CO2 31 03/12/2012   TSH 1.79 03/12/2012

## 2012-09-16 NOTE — Assessment & Plan Note (Signed)
stable overall by hx and exam, most recent data reviewed with pt, and pt to continue medical treatment as before Lab Results  Component Value Date   LDLCALC 62 03/12/2012

## 2012-09-16 NOTE — Assessment & Plan Note (Signed)
stable overall by hx and exam, most recent data reviewed with pt, and pt to continue medical treatment as before BP Readings from Last 3 Encounters:  09/16/12 130/70  03/12/12 154/72  09/12/11 120/68

## 2012-11-03 ENCOUNTER — Telehealth: Payer: Self-pay

## 2012-11-03 NOTE — Telephone Encounter (Signed)
Called [facility] to begin PA for Nexium.  They will contact the patient and our office in 7 to 10 business days regarding approval.

## 2012-11-12 ENCOUNTER — Telehealth: Payer: Self-pay

## 2012-11-12 NOTE — Telephone Encounter (Signed)
Received fax from [facility] stating that Nexium 40 mg capsule PA not required at this time.  Case [id number] and GL#[id number]

## 2013-02-14 ENCOUNTER — Other Ambulatory Visit: Payer: Self-pay | Admitting: Internal Medicine

## 2013-03-07 ENCOUNTER — Other Ambulatory Visit: Payer: Self-pay | Admitting: Internal Medicine

## 2013-03-12 ENCOUNTER — Telehealth: Payer: Self-pay

## 2013-03-12 ENCOUNTER — Other Ambulatory Visit: Payer: Medicare Other

## 2013-03-12 NOTE — Telephone Encounter (Signed)
A user error has taken place: encounter opened in error, closed for administrative reasons.

## 2013-03-17 ENCOUNTER — Encounter: Payer: Self-pay | Admitting: Internal Medicine

## 2013-03-17 ENCOUNTER — Other Ambulatory Visit (INDEPENDENT_AMBULATORY_CARE_PROVIDER_SITE_OTHER): Payer: Medicare Other

## 2013-03-17 ENCOUNTER — Ambulatory Visit (INDEPENDENT_AMBULATORY_CARE_PROVIDER_SITE_OTHER): Payer: Medicare Other | Admitting: Internal Medicine

## 2013-03-17 VITALS — BP 110/62 | HR 47 | Temp 98.3°F | Ht 61.0 in | Wt 120.0 lb

## 2013-03-17 DIAGNOSIS — M069 Rheumatoid arthritis, unspecified: Secondary | ICD-10-CM

## 2013-03-17 DIAGNOSIS — J45909 Unspecified asthma, uncomplicated: Secondary | ICD-10-CM

## 2013-03-17 DIAGNOSIS — E785 Hyperlipidemia, unspecified: Secondary | ICD-10-CM

## 2013-03-17 DIAGNOSIS — I10 Essential (primary) hypertension: Secondary | ICD-10-CM

## 2013-03-17 LAB — CBC WITH DIFFERENTIAL/PLATELET
Basophils Relative: 0.5 % (ref 0.0–3.0)
Eosinophils Relative: 1 % (ref 0.0–5.0)
HCT: 37.7 % (ref 36.0–46.0)
Hemoglobin: 12.8 g/dL (ref 12.0–15.0)
Lymphs Abs: 1.7 10*3/uL (ref 0.7–4.0)
MCV: 91.8 fl (ref 78.0–100.0)
Monocytes Absolute: 0.7 10*3/uL (ref 0.1–1.0)
Monocytes Relative: 10.3 % (ref 3.0–12.0)
Platelets: 273 10*3/uL (ref 150.0–400.0)
RBC: 4.11 Mil/uL (ref 3.87–5.11)
WBC: 6.8 10*3/uL (ref 4.5–10.5)

## 2013-03-17 LAB — LIPID PANEL
Cholesterol: 140 mg/dL (ref 0–200)
HDL: 57.2 mg/dL (ref >39.00)
LDL Cholesterol: 64 mg/dL (ref 0–99)
VLDL: 18.6 mg/dL (ref 0.0–40.0)

## 2013-03-17 LAB — BASIC METABOLIC PANEL
BUN: 23 mg/dL (ref 6–23)
Calcium: 9.5 mg/dL (ref 8.4–10.5)
Creatinine, Ser: 0.9 mg/dL (ref 0.4–1.2)
GFR: 59.2 mL/min — ABNORMAL LOW (ref >60.00)
Glucose, Bld: 92 mg/dL (ref 70–99)
Potassium: 4.6 mEq/L (ref 3.5–5.1)

## 2013-03-17 LAB — HEPATIC FUNCTION PANEL
AST: 17 U/L (ref 0–37)
Albumin: 3.4 g/dL — ABNORMAL LOW (ref 3.5–5.2)
Total Bilirubin: 0.3 mg/dL (ref 0.3–1.2)

## 2013-03-17 LAB — TSH: TSH: 1.07 u[IU]/mL (ref 0.35–5.50)

## 2013-03-17 NOTE — Assessment & Plan Note (Signed)
stable overall by history and exam, recent data reviewed with pt, and pt to continue medical treatment as before,  to f/u any worsening symptoms or concerns Lab Results  Component Value Date   WBC 6.8 03/17/2013   HGB 12.8 03/17/2013   HCT 37.7 03/17/2013   PLT 273.0 03/17/2013   GLUCOSE 92 03/17/2013   CHOL 140 03/17/2013   TRIG 93.0 03/17/2013   HDL 57.20 03/17/2013   LDLDIRECT 114.6 12/11/2007   LDLCALC 64 03/17/2013   ALT 12 03/17/2013   AST 17 03/17/2013   NA 141 03/17/2013   K 4.6 03/17/2013   CL 105 03/17/2013   CREATININE 0.9 03/17/2013   BUN 23 03/17/2013   CO2 31 03/17/2013   TSH 1.07 03/17/2013    

## 2013-03-17 NOTE — Patient Instructions (Signed)
We are sorry, but they cannot use the blood work from your recent testing as medicare would not pay to have it done before the office visit Please continue all other medications as before, and refills have been done if requested. Please have the pharmacy call with any other refills you may need. Please go to the LAB in the Basement (turn left off the elevator) for the tests to be done today You will be contacted by phone if any changes need to be made immediately.  Otherwise, you will receive a letter about your results with an explanation Please return in 6 months, or sooner if needed

## 2013-03-17 NOTE — Progress Notes (Signed)
Subjective:    Patient ID: Robin Arroyo, female    DOB: Sep 24, 1919, 79 y.o.   MRN: [id number]  HPI  Here to f/u, Here to f/u; overall doing ok,  Pt denies chest pain, increased sob or doe, wheezing, orthopnea, PND, increased LE swelling, palpitations, dizziness or syncope.  Pt denies polydipsia, polyuria, or low sugar symptoms such as weakness or confusion improved with po intake.  Pt denies new neurological symptoms such as new headache, or facial or extremity weakness or numbness.   Pt states overall good compliance with meds, has been trying to follow lower cholesterol diet, with wt overall stable, now no longer walking, chair bound. Does have mild burn to left anterior thigh with cooking accident 5 days ago.  Pt denies fever, wt loss, night sweats, loss of appetite, or other constitutional symptoms  Tried to cut back to once daily nexium last month instead of bid but reflux returned, now resolved on bid dosing. Past Medical History  Diagnosis Date  . Unspecified vitamin D deficiency 07/20/2009  . HYPERLIPIDEMIA 12/11/2007  . ANXIETY 12/11/2007  . INSOMNIA-SLEEP DISORDER-UNSPEC 12/11/2007  . DEPRESSION 12/11/2007  . HYPERTENSION 12/11/2007  . ALLERGIC RHINITIS 12/11/2007  . ASTHMA 12/11/2007  . GERD 12/11/2007  . HIATAL HERNIA 08/17/2010  . DIVERTICULOSIS, COLON 12/11/2007  . ARTHRITIS, RHEUMATOID 12/11/2007  . OSTEOARTHRITIS, KNEES, BILATERAL 01/18/2010  . OSTEOPOROSIS 12/11/2007  . INSOMNIA-SLEEP DISORDER-UNSPEC 06/08/2008  . FATIGUE 12/11/2007  . SKIN RASH 05/11/2008  . CONTUSION, LOWER LEG, RIGHT 01/19/2009  . COLONIC POLYPS, HYPERPLASTIC, HX OF 08/17/2010  . DERMATITIS FACTITIA 01/10/2011  . PRURITUS 01/10/2011  . CHEST PAIN 01/15/2011  . Single kidney     functional   . Lumbar spine pain     DJD  . Hearing loss     hearing aids  . Vitamin D deficiency   . Acromial fracture 04/17/2011   Past Surgical History  Procedure Laterality Date  . Abdominal hysterectomy    . Cholecystectomy    . S/p  bilateral knee replacements    . S/p bladder surgery    . Appendectomy    . Tonsillectomy      reports that she has quit smoking. She does not have any smokeless tobacco history on file. She reports that she does not drink alcohol or use illicit drugs. family history includes Breast cancer in her mother and sister; Cancer in her father; Diabetes in her sister; Prostate cancer in her brothers and father; and Stomach cancer in her sister.  There is no history of Colon cancer. Allergies  Allergen Reactions  . Alendronate Sodium     REACTION: constipation  . Citalopram Hydrobromide   . Hydrocodone Nausea Only  . Ibandronate Sodium     REACTION: gi upset  . Mirtazapine    Current Outpatient Prescriptions on File Prior to Visit  Medication Sig Dispense Refill  . Calcium Carbonate Antacid (TUMS PO) Take by mouth 3 (three) times daily as needed.        . Cholecalciferol (VITAMIN D) 1000 UNITS capsule Take 1,000 Units by mouth daily.        . clotrimazole-betamethasone (LOTRISONE) cream APPLY TOPICALLY TWICE DAILY  30 g  1  . fluticasone (FLOVENT HFA) 44 MCG/ACT inhaler Inhale 2 puffs into the lungs 2 (two) times daily.        Marland Kitchen levalbuterol (XOPENEX) 1.25 MG/3ML nebulizer solution Take 1 ampule by nebulization every 4 (four) hours as needed.        Marland Kitchen levocetirizine (XYZAL)  5 MG tablet Take 5 mg by mouth daily.        Marland Kitchen lisinopril (PRINIVIL,ZESTRIL) 20 MG tablet TAKE 1 TABLET EVERY DAY  30 tablet  11  . NEXIUM 40 MG capsule TAKE ONE CAPSULE TWICE A DAY  60 capsule  11  . ondansetron (ZOFRAN) 4 MG tablet Take 1/2-1 tab every 8 hours prn nausea  15 tablet  1  . polyethylene glycol (MIRALAX) powder Take 17 g by mouth. 17 gm in water by mouth once daily as needed      . sertraline (ZOLOFT) 50 MG tablet TAKE 1 TABLET EVERY DAY  30 tablet  5  . traMADol (ULTRAM) 50 MG tablet TAKE 1 TABLET BY MOUTH EVERY 6 HOURS AS NEEDED FOR PAIN  60 tablet  3  . traMADol (ULTRAM) 50 MG tablet TAKE 1 TABLET BY MOUTH  EVERY 6 HOURS AS NEEDED FOR PAIN  60 tablet  3  . triamcinolone cream (KENALOG) 0.1 % APPLY TO AFFECTED AREA TWICE A DAY  80 g  6   No current facility-administered medications on file prior to visit.   Review of Systems  Constitutional: Negative for unexpected weight change, or unusual diaphoresis  HENT: Negative for tinnitus.   Eyes: Negative for photophobia and visual disturbance.  Respiratory: Negative for choking and stridor.   Gastrointestinal: Negative for vomiting and blood in stool.  Genitourinary: Negative for hematuria and decreased urine volume.  Musculoskeletal: Negative for acute joint swelling Skin: Negative for color change and wound.  Neurological: Negative for tremors and numbness other than noted  Psychiatric/Behavioral: Negative for decreased concentration or  hyperactivity.       Objective:   Physical Exam BP 110/62  Pulse 47  Temp(Src) 98.3 F (36.8 C) (Oral)  Ht 5\' 1"  (1.549 m)  Wt 120 lb (54.432 kg)  BMI 22.69 kg/m2  SpO2 98% VS noted,  Constitutional: Pt appears well-developed and well-nourished.  HENT: Head: NCAT.  Right Ear: External ear normal.  Left Ear: External ear normal.  Eyes: Conjunctivae and EOM are normal. Pupils are equal, round, and reactive to light.  Neck: Normal range of motion. Neck supple.  Cardiovascular: Normal rate and regular rhythm.   Pulmonary/Chest: Effort normal and breath sounds normal.  Abd:  Soft, NT, non-distended, + BS Neurological: Pt is alert. Not confused  Skin: Skin is warm. No erythema.  Psychiatric: Pt behavior is normal. Thought content normal.  Trace ankle edema bilat, no synovitis    Assessment & Plan:

## 2013-03-17 NOTE — Assessment & Plan Note (Signed)
stable overall by history and exam, recent data reviewed with pt, and pt to continue medical treatment as before,  to f/u any worsening symptoms or concerns BP Readings from Last 3 Encounters:  03/17/13 110/62  09/16/12 130/70  03/12/12 154/72

## 2013-03-17 NOTE — Assessment & Plan Note (Signed)
stable overall by history and exam, recent data reviewed with pt, and pt to continue medical treatment as before,  to f/u any worsening symptoms or concerns SpO2 Readings from Last 3 Encounters:  03/17/13 98%  09/16/12 94%  03/12/12 97%

## 2013-03-17 NOTE — Assessment & Plan Note (Signed)
stable overall by history and exam, recent data reviewed with pt, and pt to continue medical treatment as before,  to f/u any worsening symptoms or concerns Lab Results  Component Value Date   LDLCALC 64 03/17/2013   For labs today

## 2013-04-04 ENCOUNTER — Other Ambulatory Visit: Payer: Self-pay | Admitting: Internal Medicine

## 2013-05-11 ENCOUNTER — Telehealth: Payer: Self-pay | Admitting: Internal Medicine

## 2013-05-11 ENCOUNTER — Other Ambulatory Visit: Payer: Self-pay | Admitting: Internal Medicine

## 2013-05-11 MED ORDER — ONDANSETRON HCL 4 MG PO TABS: ORAL_TABLET | ORAL | Status: DC

## 2013-05-11 NOTE — Telephone Encounter (Signed)
Called patient to confirm refill of her nausea medication.

## 2013-05-11 NOTE — Telephone Encounter (Signed)
Patient left message that she wants to talk to Robin.

## 2013-05-11 NOTE — Telephone Encounter (Signed)
Pt is requesting a refill on Ondansetron hcl 4 mg to be sent to [facility] on [address]

## 2013-07-11 ENCOUNTER — Other Ambulatory Visit: Payer: Self-pay | Admitting: Internal Medicine

## 2013-07-13 NOTE — Telephone Encounter (Signed)
Ok to refill? Last OV 5.13.14 Last filled 9.24.13

## 2013-07-14 NOTE — Telephone Encounter (Signed)
Faxed hardcopy to [facility] [address] [place] 

## 2013-07-14 NOTE — Telephone Encounter (Signed)
Done hardcopy to robin  

## 2013-07-24 ENCOUNTER — Telehealth: Payer: Self-pay | Admitting: Internal Medicine

## 2013-07-24 DIAGNOSIS — R269 Unspecified abnormalities of gait and mobility: Secondary | ICD-10-CM

## 2013-07-24 DIAGNOSIS — R531 Weakness: Secondary | ICD-10-CM

## 2013-07-24 NOTE — Telephone Encounter (Signed)
Pt's daughter called request home health for Robin Arroyo due to the trouble that she is having when she try to get up. Pt's daughter stated that there is no one staying at home with her to assist with this problem. Pt's daughter is very concern and would like to get this done ASAP (today). Please advise

## 2013-07-24 NOTE — Telephone Encounter (Signed)
If unable to stand or o/w do her ADLs, and there is no family support, she should go to ER now  O/w please let me know, and I can refer to home health with Home PT, but may be days to a week until able to be seen

## 2013-07-24 NOTE — Telephone Encounter (Signed)
Spoke to the daughter informed of instructions.  They would like a referral for home health

## 2013-07-24 NOTE — Telephone Encounter (Signed)
Done - home health with order for Home PT

## 2013-07-31 ENCOUNTER — Telehealth: Payer: Self-pay

## 2013-07-31 ENCOUNTER — Encounter: Payer: Self-pay | Admitting: Internal Medicine

## 2013-07-31 ENCOUNTER — Ambulatory Visit (INDEPENDENT_AMBULATORY_CARE_PROVIDER_SITE_OTHER): Payer: Medicare Other | Admitting: Internal Medicine

## 2013-07-31 VITALS — BP 132/72 | HR 61 | Temp 97.9°F | Ht 60.0 in | Wt 126.0 lb

## 2013-07-31 DIAGNOSIS — R296 Repeated falls: Secondary | ICD-10-CM

## 2013-07-31 DIAGNOSIS — F329 Major depressive disorder, single episode, unspecified: Secondary | ICD-10-CM

## 2013-07-31 DIAGNOSIS — Z23 Encounter for immunization: Secondary | ICD-10-CM

## 2013-07-31 DIAGNOSIS — M25561 Pain in right knee: Secondary | ICD-10-CM

## 2013-07-31 DIAGNOSIS — I10 Essential (primary) hypertension: Secondary | ICD-10-CM

## 2013-07-31 DIAGNOSIS — Z9181 History of falling: Secondary | ICD-10-CM

## 2013-07-31 DIAGNOSIS — F3289 Other specified depressive episodes: Secondary | ICD-10-CM

## 2013-07-31 DIAGNOSIS — M25569 Pain in unspecified knee: Secondary | ICD-10-CM

## 2013-07-31 DIAGNOSIS — R269 Unspecified abnormalities of gait and mobility: Secondary | ICD-10-CM

## 2013-07-31 DIAGNOSIS — E785 Hyperlipidemia, unspecified: Secondary | ICD-10-CM

## 2013-07-31 MED ORDER — CLOTRIMAZOLE-BETAMETHASONE 1-0.05 % EX CREA: TOPICAL_CREAM | CUTANEOUS | Status: DC

## 2013-07-31 MED ORDER — ONDANSETRON HCL 4 MG PO TABS
ORAL_TABLET | ORAL | Status: DC
Start: 2013-07-31 — End: 2014-03-16

## 2013-07-31 NOTE — Progress Notes (Signed)
Subjective:    Patient ID: Robin Arroyo, female    DOB: 01-20-1919, 79 y.o.   MRN: [id number]  HPI  Here with family after recent fall;  Due for flu shot and pneumonia. Did have a fall with trying to go to BR at home one night recent, states can get to the BR door with her wheelchair, but has to stand to enter the BR itself, and simply right knee gaveaway and fell, sitting on the floor, no injury, was able to grab on BR cabinet, stand and called on phone to daughter.   S/p bilat knee TKR, but right prox leg weaker it seems recently, more difficult to stand, as to push off more, has simply been avoiding standing up more recently due to worsening right knee pain, refused repeat knee replacement 3 yrs ago, did better with PT 3 yrs ago, had inpt rehab for 1 mo.  Needs in home PT now it seems.  Family plans to get home care arranged, consider NH later. Plans to file a claim with the [facility] that will pay for homecare, needs form filled out.  Pt denies chest pain, increased sob or doe, wheezing, orthopnea, PND, increased LE swelling, palpitations, dizziness or syncope. Pt denies new neurological symptoms such as new headache, or facial or extremity weakness or numbness   Pt denies polydipsia, polyuria, or low sugar symptoms such as weakness or confusion improved with po intake.  Pt states overall good compliance with meds, trying to follow lower cholesterol, diabetic diet, wt overall stable but little exercise however.   Does have some achiness to the bilat hips in the past 2 wks as well. Denies worsening depressive symptoms, suicidal ideation, or panic; has ongoing anxiety.  Family and a neighbor here today helping now with ADL's as needed, due for flu shot and prevnar today.  May 2014 labs reviewed with all present - essentially normal. Past Medical History  Diagnosis Date  . Unspecified vitamin D deficiency 07/20/2009  . HYPERLIPIDEMIA 12/11/2007  . ANXIETY 12/11/2007  . INSOMNIA-SLEEP DISORDER-UNSPEC 12/11/2007   . DEPRESSION 12/11/2007  . HYPERTENSION 12/11/2007  . ALLERGIC RHINITIS 12/11/2007  . ASTHMA 12/11/2007  . GERD 12/11/2007  . HIATAL HERNIA 08/17/2010  . DIVERTICULOSIS, COLON 12/11/2007  . ARTHRITIS, RHEUMATOID 12/11/2007  . OSTEOARTHRITIS, KNEES, BILATERAL 01/18/2010  . OSTEOPOROSIS 12/11/2007  . INSOMNIA-SLEEP DISORDER-UNSPEC 06/08/2008  . FATIGUE 12/11/2007  . SKIN RASH 05/11/2008  . CONTUSION, LOWER LEG, RIGHT 01/19/2009  . COLONIC POLYPS, HYPERPLASTIC, HX OF 08/17/2010  . DERMATITIS FACTITIA 01/10/2011  . PRURITUS 01/10/2011  . CHEST PAIN 01/15/2011  . Single kidney     functional   . Lumbar spine pain     DJD  . Hearing loss     hearing aids  . Vitamin D deficiency   . Acromial fracture 04/17/2011   Past Surgical History  Procedure Laterality Date  . Abdominal hysterectomy    . Cholecystectomy    . S/p bilateral knee replacements    . S/p bladder surgery    . Appendectomy    . Tonsillectomy      reports that she has quit smoking. She does not have any smokeless tobacco history on file. She reports that she does not drink alcohol or use illicit drugs. family history includes Breast cancer in her mother and sister; Cancer in her father; Diabetes in her sister; Prostate cancer in her brother, brother, and father; Stomach cancer in her sister. There is no history of Colon cancer. Allergies  Allergen  Reactions  . Alendronate Sodium     REACTION: constipation  . Citalopram Hydrobromide   . Hydrocodone Nausea Only  . Ibandronate Sodium     REACTION: gi upset  . Mirtazapine    Current Outpatient Prescriptions on File Prior to Visit  Medication Sig Dispense Refill  . Calcium Carbonate Antacid (TUMS PO) Take by mouth 3 (three) times daily as needed.        . Cholecalciferol (VITAMIN D) 1000 UNITS capsule Take 1,000 Units by mouth daily.        . fluticasone (FLOVENT HFA) 44 MCG/ACT inhaler Inhale 2 puffs into the lungs 2 (two) times daily.        Marland Kitchen levalbuterol (XOPENEX) 1.25 MG/3ML nebulizer  solution Take 1 ampule by nebulization every 4 (four) hours as needed.        Marland Kitchen levocetirizine (XYZAL) 5 MG tablet Take 5 mg by mouth daily.        Marland Kitchen lisinopril (PRINIVIL,ZESTRIL) 20 MG tablet TAKE 1 TABLET EVERY DAY  30 tablet  11  . NEXIUM 40 MG capsule TAKE ONE CAPSULE TWICE A DAY  60 capsule  10  . polyethylene glycol (MIRALAX) powder Take 17 g by mouth. 17 gm in water by mouth once daily as needed      . sertraline (ZOLOFT) 50 MG tablet TAKE 1 TABLET EVERY DAY  30 tablet  5  . traMADol (ULTRAM) 50 MG tablet TAKE 1 TABLET BY MOUTH EVERY 6 HOURS AS NEEDED FOR PAIN  60 tablet  3  . triamcinolone cream (KENALOG) 0.1 % APPLY TO AFFECTED AREA TWICE A DAY  80 g  6   No current facility-administered medications on file prior to visit.   Review of Systems  Constitutional: Negative for unexpected weight change, or unusual diaphoresis  HENT: Negative for tinnitus.   Eyes: Negative for photophobia and visual disturbance.  Respiratory: Negative for choking and stridor.   Gastrointestinal: Negative for vomiting and blood in stool.  Genitourinary: Negative for hematuria and decreased urine volume.  Musculoskeletal: Negative for acute joint swelling Skin: Negative for color change and wound.  Neurological: Negative for tremors and numbness other than noted  Psychiatric/Behavioral: Negative for decreased concentration or  hyperactivity.       Objective:   Physical Exam BP 132/72  Pulse 61  Temp(Src) 97.9 F (36.6 C) (Oral)  Ht 5' (1.524 m)  Wt 126 lb (57.153 kg)  BMI 24.61 kg/m2  SpO2 92% VS noted, not ill, quite bright, energetic and talkative for age Constitutional: Pt appears well-developed and well-nourished.  HENT: Head: NCAT.  Right Ear: External ear normal.  Left Ear: External ear normal.  TM's clear bilat Eyes: Conjunctivae and EOM are normal. Pupils are equal, round, and reactive to light.  Neck: Decreased cervical range of motion to extension/flexion, horozintal movement. Neck  supple. No LA, no JVD Cardiovascular: Normal rate and regular rhythm.  no murmur Pulmonary/Chest: Effort normal and breath sounds mild decreased bilat, no rales or wheezing Abd:  Soft, NT, non-distended, + BS, no HSM Spine: with signficant kyphosis mid and upper thoracic No significant tender bilat greater trochanter areas Neurological: Pt is alert. Not confused , motor 5/5 except has some mild right flexor weakness only, sens with some decr to LT distal to mid legs bilat, dtr diminished to achilles Dorsalis pedis 1+ bilat Right knee with bony enlargement, soft tissue swelling, warmth laterally near the iliotibial band, decrease flexion by 10 degrees, decreased extension by 20 degrees Left knee with decreased  ext/flexion by 10 degrees only Ankle/feet without swelling/tender No other active synovitis Able to stand but has lateal right knee discomfort Skin: Skin is warm. No erythema. No rash, no LE edema  Psychiatric: Pt behavior is normal. Thought content normal for age. Not depressed affect    Assessment & Plan:

## 2013-07-31 NOTE — Assessment & Plan Note (Addendum)
Multifactorial, MSK with obvous right knee instability (declines ortho or sports med) and/or iliotibial band, peripheral neuropathy and right upper leg weakness due to less use recently with increased knee pain; ok for home PT due to high risk fall, will need home care continuous 24/7 and should qualify I think for VA related homecare.  If not, would need NH soon for supportive care and help with ADL's  Note:  Total time for pt hx, exam, review of record with pt in the room, determination of diagnoses and plan for further eval and tx is > 40 min, with over 50% spent in coordination and counseling of patient

## 2013-07-31 NOTE — Telephone Encounter (Signed)
The family would like a prescription for a portable potty.  They would like to pickup rx along with [facility] paperwork on Monday.  Call back number for the daughter is [phone].

## 2013-07-31 NOTE — Assessment & Plan Note (Signed)
stable overall by history and exam, recent data reviewed with pt, and pt to continue medical treatment as before,  to f/u any worsening symptoms or concerns Lab Results  Component Value Date   LDLCALC 64 03/17/2013

## 2013-07-31 NOTE — Assessment & Plan Note (Signed)
stable overall by history and exam, recent data reviewed with pt, and pt to continue medical treatment as before,  to f/u any worsening symptoms or concerns Lab Results  Component Value Date   WBC 6.8 03/17/2013   HGB 12.8 03/17/2013   HCT 37.7 03/17/2013   PLT 273.0 03/17/2013   GLUCOSE 92 03/17/2013   CHOL 140 03/17/2013   TRIG 93.0 03/17/2013   HDL 57.20 03/17/2013   LDLDIRECT 114.6 12/11/2007   LDLCALC 64 03/17/2013   ALT 12 03/17/2013   AST 17 03/17/2013   NA 141 03/17/2013   K 4.6 03/17/2013   CL 105 03/17/2013   CREATININE 0.9 03/17/2013   BUN 23 03/17/2013   CO2 31 03/17/2013   TSH 1.07 03/17/2013    

## 2013-07-31 NOTE — Patient Instructions (Addendum)
You had the flu shot today, and the pneumonia shot today (Prevnar) No further lab work needed today Please continue all other medications as before, and refills have been done if requested. Please have the pharmacy call with any other refills you may need. Please continue your efforts at being more active, low cholesterol diet, and weight control. You are otherwise up to date with prevention measures today. You will be contacted regarding the referral for: Physical Therapy for in-home We will try to work on the [place] forms if possible  Please remember to sign up for My Chart if you have not done so, as this will be important to you in the future with finding out test results, communicating by private email, and scheduling acute appointments online when needed.  Please return in 6 months, or sooner if needed

## 2013-07-31 NOTE — Telephone Encounter (Signed)
rx Done hardcopy to robin  Not clear to me that the paperwork can be completed in that short period of time

## 2013-07-31 NOTE — Assessment & Plan Note (Signed)
stable overall by history and exam, recent data reviewed with pt, and pt to continue medical treatment as before,  to f/u any worsening symptoms or concerns BP Readings from Last 3 Encounters:  07/31/13 132/72  03/17/13 110/62  09/16/12 130/70

## 2013-08-03 DIAGNOSIS — Z0279 Encounter for issue of other medical certificate: Secondary | ICD-10-CM

## 2013-08-04 NOTE — Telephone Encounter (Signed)
rx for portable potty put in folder to go up front for [place] to complete.

## 2013-08-12 DIAGNOSIS — M069 Rheumatoid arthritis, unspecified: Secondary | ICD-10-CM

## 2013-08-12 DIAGNOSIS — M810 Age-related osteoporosis without current pathological fracture: Secondary | ICD-10-CM

## 2013-08-12 DIAGNOSIS — M1710 Unilateral primary osteoarthritis, unspecified knee: Secondary | ICD-10-CM

## 2013-08-12 DIAGNOSIS — IMO0001 Reserved for inherently not codable concepts without codable children: Secondary | ICD-10-CM

## 2013-09-07 ENCOUNTER — Other Ambulatory Visit: Payer: Self-pay | Admitting: Internal Medicine

## 2013-09-17 ENCOUNTER — Ambulatory Visit (INDEPENDENT_AMBULATORY_CARE_PROVIDER_SITE_OTHER): Payer: Medicare Other | Admitting: Internal Medicine

## 2013-09-17 ENCOUNTER — Encounter: Payer: Self-pay | Admitting: Internal Medicine

## 2013-09-17 VITALS — BP 132/70 | HR 67 | Temp 97.9°F | Wt 126.0 lb

## 2013-09-17 DIAGNOSIS — M25569 Pain in unspecified knee: Secondary | ICD-10-CM

## 2013-09-17 DIAGNOSIS — M25561 Pain in right knee: Secondary | ICD-10-CM

## 2013-09-17 DIAGNOSIS — I10 Essential (primary) hypertension: Secondary | ICD-10-CM

## 2013-09-17 DIAGNOSIS — F411 Generalized anxiety disorder: Secondary | ICD-10-CM

## 2013-09-17 NOTE — Progress Notes (Signed)
Subjective:    Patient ID: Robin Arroyo, female    DOB: 02-09-1919, 79 y.o.   MRN: [id number]  HPI  Here to f/u, fell 2.5 mo ago, now has sitter/caretaker employed - has sitter between 8am and 10 pm daily, with worsening right knee pain with swelling, heat, no giveaways or further falls, seems better with icy hot, not better with anything else.  No fever. Pt denies chest pain, increased sob or doe, wheezing, orthopnea, PND, increased LE swelling, palpitations, dizziness or syncope.   Pt denies polydipsia, polyuria.  Denies worsening depressive symptoms, suicidal ideation, or panic; has ongoing anxiety, not increased recently Past Medical History  Diagnosis Date  . Unspecified vitamin D deficiency 07/20/2009  . HYPERLIPIDEMIA 12/11/2007  . ANXIETY 12/11/2007  . INSOMNIA-SLEEP DISORDER-UNSPEC 12/11/2007  . DEPRESSION 12/11/2007  . HYPERTENSION 12/11/2007  . ALLERGIC RHINITIS 12/11/2007  . ASTHMA 12/11/2007  . GERD 12/11/2007  . HIATAL HERNIA 08/17/2010  . DIVERTICULOSIS, COLON 12/11/2007  . ARTHRITIS, RHEUMATOID 12/11/2007  . OSTEOARTHRITIS, KNEES, BILATERAL 01/18/2010  . OSTEOPOROSIS 12/11/2007  . INSOMNIA-SLEEP DISORDER-UNSPEC 06/08/2008  . FATIGUE 12/11/2007  . SKIN RASH 05/11/2008  . CONTUSION, LOWER LEG, RIGHT 01/19/2009  . COLONIC POLYPS, HYPERPLASTIC, HX OF 08/17/2010  . DERMATITIS FACTITIA 01/10/2011  . PRURITUS 01/10/2011  . CHEST PAIN 01/15/2011  . Single kidney     functional   . Lumbar spine pain     DJD  . Hearing loss     hearing aids  . Vitamin D deficiency   . Acromial fracture 04/17/2011   Past Surgical History  Procedure Laterality Date  . Abdominal hysterectomy    . Cholecystectomy    . S/p bilateral knee replacements    . S/p bladder surgery    . Appendectomy    . Tonsillectomy      reports that she has quit smoking. She does not have any smokeless tobacco history on file. She reports that she does not drink alcohol or use illicit drugs. family history includes Breast cancer in her  mother and sister; Cancer in her father; Diabetes in her sister; Prostate cancer in her brother, brother, and father; Stomach cancer in her sister. There is no history of Colon cancer. Allergies  Allergen Reactions  . Alendronate Sodium     REACTION: constipation  . Citalopram Hydrobromide   . Hydrocodone Nausea Only  . Ibandronate Sodium     REACTION: gi upset  . Mirtazapine    Current Outpatient Prescriptions on File Prior to Visit  Medication Sig Dispense Refill  . azelastine (ASTELIN) 137 MCG/SPRAY nasal spray Place 2 sprays into the nose 2 (two) times daily. Use in each nostril as directed      . Calcium Carbonate Antacid (TUMS PO) Take by mouth 3 (three) times daily as needed.        . Cholecalciferol (VITAMIN D) 1000 UNITS capsule Take 1,000 Units by mouth daily.        . clotrimazole-betamethasone (LOTRISONE) cream APPLY TOPICALLY TWICE DAILY  30 g  1  . fluticasone (FLOVENT HFA) 44 MCG/ACT inhaler Inhale 2 puffs into the lungs 2 (two) times daily.        Marland Kitchen levalbuterol (XOPENEX) 1.25 MG/3ML nebulizer solution Take 1 ampule by nebulization every 4 (four) hours as needed.        Marland Kitchen levocetirizine (XYZAL) 5 MG tablet Take 5 mg by mouth daily.        Marland Kitchen lisinopril (PRINIVIL,ZESTRIL) 20 MG tablet TAKE 1 TABLET EVERY DAY  30  tablet  11  . NEXIUM 40 MG capsule TAKE ONE CAPSULE TWICE A DAY  60 capsule  10  . ondansetron (ZOFRAN) 4 MG tablet Take 1/2-1 tab every 8 hours prn nausea  30 tablet  1  . polyethylene glycol (MIRALAX) powder Take 17 g by mouth. 17 gm in water by mouth once daily as needed      . sertraline (ZOLOFT) 50 MG tablet TAKE 1 TABLET EVERY DAY  30 tablet  5  . traMADol (ULTRAM) 50 MG tablet TAKE 1 TABLET BY MOUTH EVERY 6 HOURS AS NEEDED FOR PAIN  60 tablet  3  . triamcinolone cream (KENALOG) 0.1 % APPLY TO AFFECTED AREA TWICE A DAY  80 g  6   No current facility-administered medications on file prior to visit.     Review of Systems  Constitutional: Negative for  unexpected weight change, or unusual diaphoresis  HENT: Negative for tinnitus.   Eyes: Negative for photophobia and visual disturbance.  Respiratory: Negative for choking and stridor.   Gastrointestinal: Negative for vomiting and blood in stool.  Genitourinary: Negative for hematuria and decreased urine volume.  Musculoskeletal: Negative for acute joint swelling Skin: Negative for color change and wound.  Neurological: Negative for tremors and numbness other than noted  Psychiatric/Behavioral: Negative for decreased concentration or  hyperactivity.       Objective:   Physical Exam BP 132/70  Pulse 67  Temp(Src) 97.9 F (36.6 C) (Oral)  Wt 126 lb (57.153 kg)  SpO2 93% VS noted,  Constitutional: Pt appears frail, thin, in wheelchair  HENT: Head: NCAT.  Right Ear: External ear normal.  Left Ear: External ear normal.  Eyes: Conjunctivae and EOM are normal. Pupils are equal, round, and reactive to light.  Neck: Normal range of motion. Neck supple.  Cardiovascular: Normal rate and regular rhythm.   Pulmonary/Chest: Effort normal and breath sounds normal.  Abd:  Soft, NT, non-distended, + BS Neurological: Pt is alert. Not confused  Right knee with decreased ROM, 1+ effusion, tender medial joint line Skin: Skin is warm. No erythema.  Psychiatric: Pt behavior is normal. Thought content normal. 1+ nervous    Assessment & Plan:

## 2013-09-17 NOTE — Patient Instructions (Signed)
Please call Dr Rogue Jury office for appointment for your right knee Please continue all other medications as before, and refills have been done if requested. Please have the pharmacy call with any other refills you may need.  Please remember to sign up for My Chart if you have not done so, as this will be important to you in the future with finding out test results, communicating by private email, and scheduling acute appointments online when needed.  Please return in 6 months, or sooner if needed

## 2013-09-21 DIAGNOSIS — M25561 Pain in right knee: Secondary | ICD-10-CM | POA: Insufficient documentation

## 2013-09-21 NOTE — Assessment & Plan Note (Signed)
stable overall by history and exam, recent data reviewed with pt, and pt to continue medical treatment as before,  to f/u any worsening symptoms or concerns BP Readings from Last 3 Encounters:  09/17/13 132/70  07/31/13 132/72  03/17/13 110/62

## 2013-09-21 NOTE — Assessment & Plan Note (Signed)
stable overall by history and exam, recent data reviewed with pt, and pt to continue medical treatment as before,  to f/u any worsening symptoms or concerns Lab Results  Component Value Date   WBC 6.8 03/17/2013   HGB 12.8 03/17/2013   HCT 37.7 03/17/2013   PLT 273.0 03/17/2013   GLUCOSE 92 03/17/2013   CHOL 140 03/17/2013   TRIG 93.0 03/17/2013   HDL 57.20 03/17/2013   LDLDIRECT 114.6 12/11/2007   LDLCALC 64 03/17/2013   ALT 12 03/17/2013   AST 17 03/17/2013   NA 141 03/17/2013   K 4.6 03/17/2013   CL 105 03/17/2013   CREATININE 0.9 03/17/2013   BUN 23 03/17/2013   CO2 31 03/17/2013   TSH 1.07 03/17/2013    

## 2013-09-21 NOTE — Assessment & Plan Note (Signed)
With effusion, I offered referral to sport med today, decelins and wants to fu with Dr Darden Palmer

## 2013-11-25 ENCOUNTER — Ambulatory Visit (INDEPENDENT_AMBULATORY_CARE_PROVIDER_SITE_OTHER): Payer: Medicare Other | Admitting: *Deleted

## 2013-11-25 DIAGNOSIS — Z111 Encounter for screening for respiratory tuberculosis: Secondary | ICD-10-CM

## 2013-11-27 LAB — TB SKIN TEST
Induration: 0 mm
TB Skin Test: NEGATIVE

## 2013-12-01 ENCOUNTER — Telehealth: Payer: Self-pay | Admitting: Internal Medicine

## 2013-12-01 NOTE — Telephone Encounter (Signed)
Done today.

## 2013-12-01 NOTE — Telephone Encounter (Signed)
Mia with [employer] [facility] is calling to check the status of the patient's FL2 form that she brought in to [place]. She states that the patient is supposed to moving in this week and wants to know if we can fax FL2 form as soon as possible. Fax#: [phone]. Please advise.

## 2013-12-01 NOTE — Telephone Encounter (Signed)
Paper work faxed to [employer] as request and form still up front of facility to pick up, copy to scan.

## 2014-01-28 ENCOUNTER — Ambulatory Visit: Payer: Medicare Other | Admitting: Internal Medicine

## 2014-03-16 ENCOUNTER — Telehealth: Payer: Self-pay

## 2014-03-16 ENCOUNTER — Other Ambulatory Visit (INDEPENDENT_AMBULATORY_CARE_PROVIDER_SITE_OTHER): Payer: Medicare Other

## 2014-03-16 ENCOUNTER — Encounter: Payer: Self-pay | Admitting: Internal Medicine

## 2014-03-16 ENCOUNTER — Ambulatory Visit (INDEPENDENT_AMBULATORY_CARE_PROVIDER_SITE_OTHER): Payer: Medicare Other | Admitting: Internal Medicine

## 2014-03-16 VITALS — BP 132/82 | HR 65 | Temp 98.1°F

## 2014-03-16 DIAGNOSIS — I10 Essential (primary) hypertension: Secondary | ICD-10-CM

## 2014-03-16 DIAGNOSIS — F329 Major depressive disorder, single episode, unspecified: Secondary | ICD-10-CM

## 2014-03-16 DIAGNOSIS — E785 Hyperlipidemia, unspecified: Secondary | ICD-10-CM

## 2014-03-16 DIAGNOSIS — Z0000 Encounter for general adult medical examination without abnormal findings: Secondary | ICD-10-CM | POA: Insufficient documentation

## 2014-03-16 DIAGNOSIS — F3289 Other specified depressive episodes: Secondary | ICD-10-CM

## 2014-03-16 DIAGNOSIS — K219 Gastro-esophageal reflux disease without esophagitis: Secondary | ICD-10-CM

## 2014-03-16 LAB — LIPID PANEL
Cholesterol: 154 mg/dL (ref 0–200)
HDL: 55.4 mg/dL (ref >39.00)
LDL CALC: 75 mg/dL (ref 0–99)
Total CHOL/HDL Ratio: 3
Triglycerides: 120 mg/dL (ref 0.0–149.0)
VLDL: 24 mg/dL (ref 0.0–40.0)

## 2014-03-16 LAB — BASIC METABOLIC PANEL
BUN: 16 mg/dL (ref 6–23)
CALCIUM: 9.6 mg/dL (ref 8.4–10.5)
CO2: 32 mEq/L (ref 19–32)
Chloride: 102 mEq/L (ref 96–112)
Creatinine, Ser: 0.9 mg/dL (ref 0.4–1.2)
GFR: 64.59 mL/min (ref >60.00)
GLUCOSE: 86 mg/dL (ref 70–99)
Potassium: 4.6 mEq/L (ref 3.5–5.1)
SODIUM: 140 meq/L (ref 135–145)

## 2014-03-16 LAB — CBC WITH DIFFERENTIAL/PLATELET
BASOS PCT: 0.4 % (ref 0.0–3.0)
Basophils Absolute: 0 10*3/uL (ref 0.0–0.1)
EOS PCT: 2.3 % (ref 0.0–5.0)
Eosinophils Absolute: 0.2 10*3/uL (ref 0.0–0.7)
HEMATOCRIT: 36.4 % (ref 36.0–46.0)
HEMOGLOBIN: 12.2 g/dL (ref 12.0–15.0)
LYMPHS ABS: 2 10*3/uL (ref 0.7–4.0)
Lymphocytes Relative: 26.3 % (ref 12.0–46.0)
MCHC: 33.6 g/dL (ref 30.0–36.0)
MCV: 94 fl (ref 78.0–100.0)
MONOS PCT: 9.3 % (ref 3.0–12.0)
Monocytes Absolute: 0.7 10*3/uL (ref 0.1–1.0)
NEUTROS ABS: 4.6 10*3/uL (ref 1.4–7.7)
Neutrophils Relative %: 61.7 % (ref 43.0–77.0)
Platelets: 256 10*3/uL (ref 150.0–400.0)
RBC: 3.87 Mil/uL (ref 3.87–5.11)
RDW: 14.2 % (ref 11.5–15.5)
WBC: 7.4 10*3/uL (ref 4.0–10.5)

## 2014-03-16 LAB — HEPATIC FUNCTION PANEL
ALT: 8 U/L (ref 0–35)
AST: 18 U/L (ref 0–37)
Albumin: 3.5 g/dL (ref 3.5–5.2)
Alkaline Phosphatase: 40 U/L (ref 39–117)
BILIRUBIN TOTAL: 0.7 mg/dL (ref 0.2–1.2)
Bilirubin, Direct: 0.1 mg/dL (ref 0.0–0.3)
Total Protein: 6.7 g/dL (ref 6.0–8.3)

## 2014-03-16 LAB — TSH: TSH: 1.58 u[IU]/mL (ref 0.35–4.50)

## 2014-03-16 MED ORDER — TRIAMCINOLONE ACETONIDE 0.1 % EX CREA
TOPICAL_CREAM | CUTANEOUS | Status: DC
Start: 2014-03-16 — End: 2014-03-17

## 2014-03-16 MED ORDER — ONDANSETRON HCL 4 MG PO TABS: ORAL_TABLET | ORAL | Status: DC

## 2014-03-16 MED ORDER — CLOTRIMAZOLE-BETAMETHASONE 1-0.05 % EX CREA: TOPICAL_CREAM | CUTANEOUS | Status: DC

## 2014-03-16 NOTE — Progress Notes (Signed)
Pre visit review using our clinic review tool, if applicable. No additional management support is needed unless otherwise documented below in the visit note. 

## 2014-03-16 NOTE — Progress Notes (Signed)
Subjective:    Patient ID: Robin Arroyo, female    DOB: 06-04-1919, 80 y.o.   MRN: [id number]  HPI  Here for yearly f/u;  Overall doing ok;  Pt denies CP, worsening SOB, DOE, wheezing, orthopnea, PND, worsening LE edema, palpitations, dizziness or syncope.  Pt denies neurological change such as new headache, facial or extremity weakness.  Pt denies polydipsia, polyuria, or low sugar symptoms. Pt states overall good compliance with treatment and medications, good tolerability, and has been trying to follow lower cholesterol diet.  Pt denies worsening depressive symptoms, suicidal ideation or panic. No fever, night sweats, wt loss, loss of appetite, or other constitutional symptoms.  Pt states good ability with ADL's, has low fall risk, home safety reviewed and adequate, no other significant changes in hearing or vision, and only occasionally active with exercise. Denies worsening reflux, abd pain, dysphagia, n/v, bowel change or blood, overall less reflux now living at assisted living. Severe HOH today.  Trying to aovid diuretics due to incontinence, stable LE edema, no longer walking, in wheelchair.  Taking 1 boost per day Past Medical History  Diagnosis Date  . Unspecified vitamin D deficiency 07/20/2009  . HYPERLIPIDEMIA 12/11/2007  . ANXIETY 12/11/2007  . INSOMNIA-SLEEP DISORDER-UNSPEC 12/11/2007  . DEPRESSION 12/11/2007  . HYPERTENSION 12/11/2007  . ALLERGIC RHINITIS 12/11/2007  . ASTHMA 12/11/2007  . GERD 12/11/2007  . HIATAL HERNIA 08/17/2010  . DIVERTICULOSIS, COLON 12/11/2007  . ARTHRITIS, RHEUMATOID 12/11/2007  . OSTEOARTHRITIS, KNEES, BILATERAL 01/18/2010  . OSTEOPOROSIS 12/11/2007  . INSOMNIA-SLEEP DISORDER-UNSPEC 06/08/2008  . FATIGUE 12/11/2007  . SKIN RASH 05/11/2008  . CONTUSION, LOWER LEG, RIGHT 01/19/2009  . COLONIC POLYPS, HYPERPLASTIC, HX OF 08/17/2010  . DERMATITIS FACTITIA 01/10/2011  . PRURITUS 01/10/2011  . CHEST PAIN 01/15/2011  . Single kidney     functional   . Lumbar spine pain     DJD    . Hearing loss     hearing aids  . Vitamin D deficiency   . Acromial fracture 04/17/2011   Past Surgical History  Procedure Laterality Date  . Abdominal hysterectomy    . Cholecystectomy    . S/p bilateral knee replacements    . S/p bladder surgery    . Appendectomy    . Tonsillectomy      reports that she has quit smoking. She does not have any smokeless tobacco history on file. She reports that she does not drink alcohol or use illicit drugs. family history includes Breast cancer in her mother and sister; Cancer in her father; Diabetes in her sister; Prostate cancer in her brother, brother, and father; Stomach cancer in her sister. There is no history of Colon cancer. Allergies  Allergen Reactions  . Alendronate Sodium     REACTION: constipation  . Citalopram Hydrobromide   . Hydrocodone Nausea Only  . Ibandronate Sodium     REACTION: gi upset  . Mirtazapine    Current Outpatient Prescriptions on File Prior to Visit  Medication Sig Dispense Refill  . azelastine (ASTELIN) 137 MCG/SPRAY nasal spray Place 2 sprays into the nose 2 (two) times daily. Use in each nostril as directed      . Calcium Carbonate Antacid (TUMS PO) Take by mouth 3 (three) times daily as needed.        . Cholecalciferol (VITAMIN D) 1000 UNITS capsule Take 1,000 Units by mouth daily.        . fluticasone (FLOVENT HFA) 44 MCG/ACT inhaler Inhale 2 puffs into the lungs 2 (  two) times daily.        Marland Kitchen levalbuterol (XOPENEX) 1.25 MG/3ML nebulizer solution Take 1 ampule by nebulization every 4 (four) hours as needed.        Marland Kitchen levocetirizine (XYZAL) 5 MG tablet Take 5 mg by mouth daily.        Marland Kitchen lisinopril (PRINIVIL,ZESTRIL) 20 MG tablet TAKE 1 TABLET EVERY DAY  30 tablet  11  . NEXIUM 40 MG capsule TAKE ONE CAPSULE TWICE A DAY  60 capsule  10  . polyethylene glycol (MIRALAX) powder Take 17 g by mouth. 17 gm in water by mouth once daily as needed      . sertraline (ZOLOFT) 50 MG tablet TAKE 1 TABLET EVERY DAY  30  tablet  5  . traMADol (ULTRAM) 50 MG tablet TAKE 1 TABLET BY MOUTH EVERY 6 HOURS AS NEEDED FOR PAIN  60 tablet  3   No current facility-administered medications on file prior to visit.   Review of Systems Constitutional: Negative for increased diaphoresis, other activity, appetite or other siginficant weight change  HENT: Negative for worsening hearing loss, ear pain, facial swelling, mouth sores and neck stiffness.   Eyes: Negative for other worsening pain, redness or visual disturbance.  Respiratory: Negative for shortness of breath and wheezing.   Cardiovascular: Negative for chest pain and palpitations.  Gastrointestinal: Negative for diarrhea, blood in stool, abdominal distention or other pain Genitourinary: Negative for hematuria, flank pain or change in urine volume.  Musculoskeletal: Negative for myalgias or other joint complaints.  Skin: Negative for color change and wound.  Neurological: Negative for syncope and numbness. other than noted Hematological: Negative for adenopathy. or other swelling Psychiatric/Behavioral: Negative for hallucinations, self-injury, decreased concentration or other worsening agitation.      Objective:   Physical Exam BP 132/82  Pulse 65  Temp(Src) 98.1 F (36.7 C) (Oral)  SpO2 96% VS noted,  Constitutional: Pt is oriented to person, place, and time. Appears thin, in wheelchair Head: Normocephalic and atraumatic.  Right Ear: External ear normal.  Left Ear: External ear normal.  Nose: Nose normal.  Mouth/Throat: Oropharynx is clear and moist.  Eyes: Conjunctivae and EOM are normal. Pupils are equal, round, and reactive to light.  Neck: Normal range of motion. Neck supple. No JVD present. No tracheal deviation present.  Cardiovascular: Normal rate, regular rhythm, normal heart sounds and intact distal pulses.   Pulmonary/Chest: Effort normal and breath sounds without rales or wheezing  Abdominal: Soft. Bowel sounds are normal. NT. No HSM    Musculoskeletal: Normal range of motion. Exhibits no edema.  Lymphadenopathy:  Has no cervical adenopathy.  Neurological: Pt is alert and oriented to person, place, and time. Pt has normal reflexes. No cranial nerve deficit. Motor grossly intact Skin: Skin is warm and dry. No rash noted.  Psychiatric:  Has normal mood and affect. Behavior is normal.     Assessment & Plan:

## 2014-03-16 NOTE — Assessment & Plan Note (Signed)
stable overall by history and exam, recent data reviewed with pt, and pt to continue medical treatment as before,  to f/u any worsening symptoms or concerns Lab Results  Component Value Date   WBC 6.8 03/17/2013   HGB 12.8 03/17/2013   HCT 37.7 03/17/2013   PLT 273.0 03/17/2013   GLUCOSE 92 03/17/2013   CHOL 140 03/17/2013   TRIG 93.0 03/17/2013   HDL 57.20 03/17/2013   LDLDIRECT 114.6 12/11/2007   LDLCALC 64 03/17/2013   ALT 12 03/17/2013   AST 17 03/17/2013   NA 141 03/17/2013   K 4.6 03/17/2013   CL 105 03/17/2013   CREATININE 0.9 03/17/2013   BUN 23 03/17/2013   CO2 31 03/17/2013   TSH 1.07 03/17/2013

## 2014-03-16 NOTE — Assessment & Plan Note (Signed)
Overall stable with mild occas breakthrough symptoms,  to f/u any worsening symptoms or concerns

## 2014-03-16 NOTE — Assessment & Plan Note (Signed)
stable overall by history and exam, recent data reviewed with pt, and pt to continue medical treatment as before,  to f/u any worsening symptoms or concerns BP Readings from Last 3 Encounters:  03/16/14 132/82  09/17/13 132/70  07/31/13 132/72

## 2014-03-16 NOTE — Patient Instructions (Signed)

## 2014-03-16 NOTE — Assessment & Plan Note (Signed)

## 2014-03-16 NOTE — Telephone Encounter (Signed)
The patients nursing home called to inquire what medications were sent in at todays OV.  Informed what they were and that PCP sent to [facility] [address]  Phone number at nursing home [phone]

## 2014-03-17 ENCOUNTER — Telehealth: Payer: Self-pay | Admitting: Internal Medicine

## 2014-03-17 MED ORDER — CLOTRIMAZOLE-BETAMETHASONE 1-0.05 % EX CREA: TOPICAL_CREAM | CUTANEOUS | Status: DC

## 2014-03-17 MED ORDER — TRIAMCINOLONE ACETONIDE 0.1 % EX CREA: TOPICAL_CREAM | CUTANEOUS | Status: DC

## 2014-03-17 MED ORDER — ONDANSETRON HCL 4 MG PO TABS: ORAL_TABLET | ORAL | Status: DC

## 2014-03-17 NOTE — Telephone Encounter (Signed)
Reprinted scripts faxed to # below...Raechel Chute

## 2014-03-17 NOTE — Telephone Encounter (Signed)
Sherri with [facility] (pt's nursing home) called and states that [facility] prescriptions at [facility] need to be cancelled because the patient uses [facility], which is associated with their company. All prescriptions rx'd yesterday need to be sent to them at Fax#:  [phone]

## 2014-07-29 DIAGNOSIS — Z0279 Encounter for issue of other medical certificate: Secondary | ICD-10-CM

## 2014-09-14 ENCOUNTER — Ambulatory Visit: Payer: Medicare Other | Admitting: Internal Medicine

## 2014-09-15 ENCOUNTER — Encounter: Payer: Self-pay | Admitting: Internal Medicine

## 2014-09-15 ENCOUNTER — Ambulatory Visit (INDEPENDENT_AMBULATORY_CARE_PROVIDER_SITE_OTHER): Payer: Medicare Other | Admitting: Internal Medicine

## 2014-09-15 VITALS — BP 124/68 | HR 58 | Temp 98.0°F

## 2014-09-15 DIAGNOSIS — Z7189 Other specified counseling: Secondary | ICD-10-CM | POA: Insufficient documentation

## 2014-09-15 DIAGNOSIS — E785 Hyperlipidemia, unspecified: Secondary | ICD-10-CM

## 2014-09-15 DIAGNOSIS — I10 Essential (primary) hypertension: Secondary | ICD-10-CM

## 2014-09-15 DIAGNOSIS — M069 Rheumatoid arthritis, unspecified: Secondary | ICD-10-CM

## 2014-09-15 MED ORDER — METHYLPREDNISOLONE ACETATE 80 MG/ML IJ SUSP
80.0000 mg | Freq: Once | INTRAMUSCULAR | Status: AC
  Administered 2014-09-15: 80 mg via INTRAMUSCULAR

## 2014-09-15 NOTE — Progress Notes (Signed)
Pre visit review using our clinic review tool, if applicable. No additional management support is needed unless otherwise documented below in the visit note. 

## 2014-09-15 NOTE — Patient Instructions (Signed)
You had the steroid shot today  Please continue all other medications as before, and refills have been done if requested.  Please have the pharmacy call with any other refills you may need.  Please continue your efforts at being more active, low cholesterol diet, and weight control.  Please keep your appointments with your specialists as you may have planned  Your out of hospital DNR form was signed today and given to you  Please return in 6 months, or sooner if needed

## 2014-09-15 NOTE — Assessment & Plan Note (Signed)
stable overall by history and exam, recent data reviewed with pt, and pt to continue medical treatment as before,  to f/u any worsening symptoms or concerns BP Readings from Last 3 Encounters:  09/15/14 124/68  03/16/14 132/82  09/17/13 132/70

## 2014-09-15 NOTE — Assessment & Plan Note (Signed)
With mild flare, for depomedrol IM x 1 today

## 2014-09-15 NOTE — Assessment & Plan Note (Signed)
stable overall by history and exam, recent data reviewed with pt, and pt to continue medical treatment as before,  to f/u any worsening symptoms or concerns Lab Results  Component Value Date   LDLCALC 75 03/16/2014

## 2014-09-15 NOTE — Progress Notes (Signed)
Subjective:    Patient ID: Robin Arroyo, female    DOB: 06-16-1919, 80 y.o.   MRN: [id number]  HPI  Here to f/u; overall doing ok,  Pt denies chest pain, increased sob or doe, wheezing, orthopnea, PND, increased LE swelling, palpitations, dizziness or syncope.  Pt denies polydipsia, polyuria, or low sugar symptoms such as weakness or confusion improved with po intake.  Pt denies new neurological symptoms such as new headache, or facial or extremity weakness or numbness.   Pt states overall good compliance with meds, has been trying to follow lower cholesterol, diabetic diet, with wt overall stable,  but little exercise however. No current complaints except for mild to mod worsening bilat hand pain, hx of RA, no swelling however.  Has some bruising to the arms but o/w seems cognitively to be doing quite nicely.  Papers with her from the NH indicate DNR status has not been addressed.  Pt denies fever, wt loss, night sweats, loss of appetite, or other constitutional symptoms, has gained a few lbs since last visit Past Medical History  Diagnosis Date  . Unspecified vitamin D deficiency 07/20/2009  . HYPERLIPIDEMIA 12/11/2007  . ANXIETY 12/11/2007  . INSOMNIA-SLEEP DISORDER-UNSPEC 12/11/2007  . DEPRESSION 12/11/2007  . HYPERTENSION 12/11/2007  . ALLERGIC RHINITIS 12/11/2007  . ASTHMA 12/11/2007  . GERD 12/11/2007  . HIATAL HERNIA 08/17/2010  . DIVERTICULOSIS, COLON 12/11/2007  . ARTHRITIS, RHEUMATOID 12/11/2007  . OSTEOARTHRITIS, KNEES, BILATERAL 01/18/2010  . OSTEOPOROSIS 12/11/2007  . INSOMNIA-SLEEP DISORDER-UNSPEC 06/08/2008  . FATIGUE 12/11/2007  . SKIN RASH 05/11/2008  . CONTUSION, LOWER LEG, RIGHT 01/19/2009  . COLONIC POLYPS, HYPERPLASTIC, HX OF 08/17/2010  . DERMATITIS FACTITIA 01/10/2011  . PRURITUS 01/10/2011  . CHEST PAIN 01/15/2011  . Single kidney     functional   . Lumbar spine pain     DJD  . Hearing loss     hearing aids  . Vitamin D deficiency   . Acromial fracture 04/17/2011   Past Surgical  History  Procedure Laterality Date  . Abdominal hysterectomy    . Cholecystectomy    . S/p bilateral knee replacements    . S/p bladder surgery    . Appendectomy    . Tonsillectomy      reports that she has quit smoking. She does not have any smokeless tobacco history on file. She reports that she does not drink alcohol or use illicit drugs. family history includes Breast cancer in her mother and sister; Cancer in her father; Diabetes in her sister; Prostate cancer in her brother, brother, and father; Stomach cancer in her sister. There is no history of Colon cancer. Allergies  Allergen Reactions  . Alendronate Sodium     REACTION: constipation  . Citalopram Hydrobromide   . Hydrocodone Nausea Only  . Ibandronate Sodium     REACTION: gi upset  . Mirtazapine    Current Outpatient Prescriptions on File Prior to Visit  Medication Sig Dispense Refill  . azelastine (ASTELIN) 137 MCG/SPRAY nasal spray Place 2 sprays into the nose 2 (two) times daily. Use in each nostril as directed    . Calcium Carbonate Antacid (TUMS PO) Take by mouth 3 (three) times daily as needed.      . Cholecalciferol (VITAMIN D) 1000 UNITS capsule Take 1,000 Units by mouth daily.      . clotrimazole-betamethasone (LOTRISONE) cream APPLY TOPICALLY TWICE DAILY as needed 30 g 1  . fluticasone (FLOVENT HFA) 44 MCG/ACT inhaler Inhale 2 puffs into the lungs  2 (two) times daily.      Marland Kitchen levalbuterol (XOPENEX) 1.25 MG/3ML nebulizer solution Take 1 ampule by nebulization every 4 (four) hours as needed.      Marland Kitchen levocetirizine (XYZAL) 5 MG tablet Take 5 mg by mouth daily.      Marland Kitchen lisinopril (PRINIVIL,ZESTRIL) 20 MG tablet TAKE 1 TABLET EVERY DAY 30 tablet 11  . NEXIUM 40 MG capsule TAKE ONE CAPSULE TWICE A DAY 60 capsule 10  . ondansetron (ZOFRAN) 4 MG tablet Take 1/2-1 tab every 8 hours prn nausea 30 tablet 2  . polyethylene glycol (MIRALAX) powder Take 17 g by mouth. 17 gm in water by mouth once daily as needed    . sertraline  (ZOLOFT) 50 MG tablet TAKE 1 TABLET EVERY DAY 30 tablet 5  . traMADol (ULTRAM) 50 MG tablet TAKE 1 TABLET BY MOUTH EVERY 6 HOURS AS NEEDED FOR PAIN 60 tablet 3  . triamcinolone cream (KENALOG) 0.1 % APPLY TO AFFECTED AREA TWICE A DAY 80 g 6   No current facility-administered medications on file prior to visit.   Review of Systems  Constitutional: Negative for unusual diaphoresis or other sweats  HENT: Negative for ringing in ear Eyes: Negative for double vision or worsening visual disturbance.  Respiratory: Negative for choking and stridor.   Gastrointestinal: Negative for vomiting or other signifcant bowel change Genitourinary: Negative for hematuria or decreased urine volume.  Musculoskeletal: Negative for other MSK pain or swelling Skin: Negative for color change and worsening wound.  Neurological: Negative for tremors and numbness other than noted  Psychiatric/Behavioral: Negative for decreased concentration or agitation other than above       Objective:   Physical Exam BP 124/68 mmHg  Pulse 58  Temp(Src) 98 F (36.7 C) (Oral)  SpO2 96% VS noted, frail elderly Constitutional: Pt appears well-developed, well-nourished.  HENT: Head: NCAT.  Right Ear: External ear normal.  Left Ear: External ear normal.  Eyes: . Pupils are equal, round, and reactive to light. Conjunctivae and EOM are normal Neck: Normal range of motion. Neck supple.  Cardiovascular: Normal rate and regular rhythm.   Pulmonary/Chest: Effort normal and breath sounds normal.  Abd:  Soft, NT, ND, + BS Neurological: Pt is alert. Not confused , motor grossly intact Skin: Skin is warm. No rash Hand with bony joint changes, small trace effusions MCP's Psychiatric: Pt behavior is normal. No agitation.     Assessment & Plan:

## 2014-09-15 NOTE — Assessment & Plan Note (Signed)
Pt agrees to DNR, out of hosp DNR form signed

## 2014-11-19 ENCOUNTER — Ambulatory Visit (INDEPENDENT_AMBULATORY_CARE_PROVIDER_SITE_OTHER): Payer: Medicare Other | Admitting: Internal Medicine

## 2014-11-19 ENCOUNTER — Encounter: Payer: Self-pay | Admitting: Internal Medicine

## 2014-11-19 VITALS — BP 120/82 | HR 73 | Temp 97.3°F

## 2014-11-19 DIAGNOSIS — J4520 Mild intermittent asthma, uncomplicated: Secondary | ICD-10-CM

## 2014-11-19 DIAGNOSIS — I10 Essential (primary) hypertension: Secondary | ICD-10-CM

## 2014-11-19 DIAGNOSIS — R21 Rash and other nonspecific skin eruption: Secondary | ICD-10-CM | POA: Insufficient documentation

## 2014-11-19 MED ORDER — HYDROXYZINE HCL 25 MG PO TABS: 25.0000 mg | ORAL_TABLET | Freq: Three times a day (TID) | ORAL | Status: DC | PRN

## 2014-11-19 MED ORDER — METHYLPREDNISOLONE ACETATE 80 MG/ML IJ SUSP
80.0000 mg | Freq: Once | INTRAMUSCULAR | Status: AC
  Administered 2014-11-19: 80 mg via INTRAMUSCULAR

## 2014-11-19 MED ORDER — DIPHENHYDRAMINE HCL 25 MG PO TABS: ORAL_TABLET | ORAL | Status: DC

## 2014-11-19 MED ORDER — PREDNISONE 10 MG PO TABS: ORAL_TABLET | ORAL | Status: DC

## 2014-11-19 NOTE — Patient Instructions (Signed)
You had the steroid shot today  Please take all new medication as prescribed - the prednisone, and atarax for itching as needed  You could also try Benadryl 25-50 mg every 6 hrs if needed for itching as well  OK to STOP the Lisinopril (blood pressure medication)  Please continue all other medications as before,   Please have the pharmacy call with any other refills you may need. . Please keep your appointments with your specialists as you may have planned  Please return in 1 Week

## 2014-11-19 NOTE — Progress Notes (Signed)
Pre visit review using our clinic review tool, if applicable. No additional management support is needed unless otherwise documented below in the visit note. 

## 2014-11-19 NOTE — Progress Notes (Signed)
Subjective:    Patient ID: Robin Arroyo, female    DOB: 01/12/1919, 82 y.o.   MRN: [id number]  HPI  Here with acute visit, co sudden onset diffuse rash to torso and extremities x 1-2 days with intense pruritis, sent by facility with ? Of shingles.  Has some edema mid left bicep area nontender, no other swelling such as lips or tongue, no sob or wheezing. Pt denies chest pain, increased sob or doe, wheezing, orthopnea, PND, increased LE swelling, palpitations, dizziness or syncope.  Pt denies new neurological symptoms such as new headache, or facial or extremity weakness or numbness   Pt denies polydipsia, polyuria Past Medical History  Diagnosis Date  . Unspecified vitamin D deficiency 07/20/2009  . HYPERLIPIDEMIA 12/11/2007  . ANXIETY 12/11/2007  . INSOMNIA-SLEEP DISORDER-UNSPEC 12/11/2007  . DEPRESSION 12/11/2007  . HYPERTENSION 12/11/2007  . ALLERGIC RHINITIS 12/11/2007  . ASTHMA 12/11/2007  . GERD 12/11/2007  . HIATAL HERNIA 08/17/2010  . DIVERTICULOSIS, COLON 12/11/2007  . ARTHRITIS, RHEUMATOID 12/11/2007  . OSTEOARTHRITIS, KNEES, BILATERAL 01/18/2010  . OSTEOPOROSIS 12/11/2007  . INSOMNIA-SLEEP DISORDER-UNSPEC 06/08/2008  . FATIGUE 12/11/2007  . SKIN RASH 05/11/2008  . CONTUSION, LOWER LEG, RIGHT 01/19/2009  . COLONIC POLYPS, HYPERPLASTIC, HX OF 08/17/2010  . DERMATITIS FACTITIA 01/10/2011  . PRURITUS 01/10/2011  . CHEST PAIN 01/15/2011  . Single kidney     functional   . Lumbar spine pain     DJD  . Hearing loss     hearing aids  . Vitamin D deficiency   . Acromial fracture 04/17/2011   Past Surgical History  Procedure Laterality Date  . Abdominal hysterectomy    . Cholecystectomy    . S/p bilateral knee replacements    . S/p bladder surgery    . Appendectomy    . Tonsillectomy      reports that she has quit smoking. She does not have any smokeless tobacco history on file. She reports that she does not drink alcohol or use illicit drugs. family history includes Breast cancer in her mother  and sister; Cancer in her father; Diabetes in her sister; Prostate cancer in her brother, brother, and father; Stomach cancer in her sister. There is no history of Colon cancer. Allergies  Allergen Reactions  . Alendronate Sodium     REACTION: constipation  . Citalopram Hydrobromide   . Hydrocodone Nausea Only  . Ibandronate Sodium     REACTION: gi upset  . Mirtazapine    Review of Systems  Constitutional: Negative for unusual diaphoresis or other sweats  HENT: Negative for ringing in ear Eyes: Negative for double vision or worsening visual disturbance.  Respiratory: Negative for choking and stridor.   Gastrointestinal: Negative for vomiting or other signifcant bowel change Genitourinary: Negative for hematuria or decreased urine volume.  Musculoskeletal: Negative for other MSK pain or swelling Skin: Negative for color change and worsening wound.  Neurological: Negative for tremors and numbness other than noted  Psychiatric/Behavioral: Negative for decreased concentration or agitation other than above       Objective:   Physical Exam BP 120/82 mmHg  Pulse 73  Temp(Src) 97.3 F (36.3 C) (Oral)  SpO2 91% VS noted, nontoxic Constitutional: Pt appears well-developed, well-nourished.  HENT: Head: NCAT.  Right Ear: External ear normal.  Left Ear: External ear normal.  Eyes: . Pupils are equal, round, and reactive to light. Conjunctivae and EOM are normal Neck: Normal range of motion. Neck supple.  Cardiovascular: Normal rate and regular rhythm.  Pulmonary/Chest: Effort normal and breath sounds without rales or wheezing.  Neurological: Pt is alert. Not confused , motor grossly intact Skin: Skin is warm. Diffuse nontender erythema maculopapular to torso and prox extremities, some nontender angioedema to left mid bicep area Psychiatric: Pt behavior with mild agitation, cant stop scratching , very uncomfortable    Assessment & Plan:

## 2014-11-19 NOTE — Assessment & Plan Note (Addendum)
C/w prob ACE related vs other,  For depomedrol IM, predpac asd, atarax prn, f/u 1 wk

## 2014-11-20 NOTE — Assessment & Plan Note (Addendum)
To d/c ACEI due to rash, f/u ROV in 1 wk

## 2014-11-20 NOTE — Assessment & Plan Note (Signed)
stable overall by history and exam, recent data reviewed with pt, and pt to continue medical treatment as before,  to f/u any worsening symptoms or concerns SpO2 Readings from Last 3 Encounters:  11/19/14 91%  09/15/14 96%  03/16/14 96%

## 2014-11-22 ENCOUNTER — Telehealth: Payer: Self-pay | Admitting: Internal Medicine

## 2014-11-22 NOTE — Telephone Encounter (Signed)
emmi mailed  °

## 2014-11-25 ENCOUNTER — Ambulatory Visit (INDEPENDENT_AMBULATORY_CARE_PROVIDER_SITE_OTHER): Payer: Medicare Other | Admitting: Internal Medicine

## 2014-11-25 ENCOUNTER — Encounter: Payer: Self-pay | Admitting: Internal Medicine

## 2014-11-25 VITALS — BP 112/72 | HR 68 | Temp 98.2°F

## 2014-11-25 DIAGNOSIS — F411 Generalized anxiety disorder: Secondary | ICD-10-CM

## 2014-11-25 DIAGNOSIS — R490 Dysphonia: Secondary | ICD-10-CM

## 2014-11-25 DIAGNOSIS — I10 Essential (primary) hypertension: Secondary | ICD-10-CM

## 2014-11-25 DIAGNOSIS — R21 Rash and other nonspecific skin eruption: Secondary | ICD-10-CM

## 2014-11-25 NOTE — Progress Notes (Signed)
Subjective:    Patient ID: Robin Arroyo, female    DOB: February 09, 1919, 81 y.o.   MRN: [id number]  HPI  Here to f/u recent rash and BP - rash now resolved, no itching or swelling, and Pt denies chest pain, increased sob or doe, wheezing, orthopnea, PND, increased LE swelling, palpitations, dizziness or syncope.   Pt denies fever, wt loss, night sweats, loss of appetite, or other constitutional symptoms  Pt denies new neurological symptoms such as new headache, or facial or extremity weakness or numbness   Pt denies polydipsia, polyuria, Also c/o hoarseness ongoing for several years, has seen ENT with neg exam per pt , non smoker Past Medical History  Diagnosis Date  . Unspecified vitamin D deficiency 07/20/2009  . HYPERLIPIDEMIA 12/11/2007  . ANXIETY 12/11/2007  . INSOMNIA-SLEEP DISORDER-UNSPEC 12/11/2007  . DEPRESSION 12/11/2007  . HYPERTENSION 12/11/2007  . ALLERGIC RHINITIS 12/11/2007  . ASTHMA 12/11/2007  . GERD 12/11/2007  . HIATAL HERNIA 08/17/2010  . DIVERTICULOSIS, COLON 12/11/2007  . ARTHRITIS, RHEUMATOID 12/11/2007  . OSTEOARTHRITIS, KNEES, BILATERAL 01/18/2010  . OSTEOPOROSIS 12/11/2007  . INSOMNIA-SLEEP DISORDER-UNSPEC 06/08/2008  . FATIGUE 12/11/2007  . SKIN RASH 05/11/2008  . CONTUSION, LOWER LEG, RIGHT 01/19/2009  . COLONIC POLYPS, HYPERPLASTIC, HX OF 08/17/2010  . DERMATITIS FACTITIA 01/10/2011  . PRURITUS 01/10/2011  . CHEST PAIN 01/15/2011  . Single kidney     functional   . Lumbar spine pain     DJD  . Hearing loss     hearing aids  . Vitamin D deficiency   . Acromial fracture 04/17/2011   Past Surgical History  Procedure Laterality Date  . Abdominal hysterectomy    . Cholecystectomy    . S/p bilateral knee replacements    . S/p bladder surgery    . Appendectomy    . Tonsillectomy      reports that she has quit smoking. She does not have any smokeless tobacco history on file. She reports that she does not drink alcohol or use illicit drugs. family history includes Breast cancer in  her mother and sister; Cancer in her father; Diabetes in her sister; Prostate cancer in her brother, brother, and father; Stomach cancer in her sister. There is no history of Colon cancer. Allergies  Allergen Reactions  . Alendronate Sodium     REACTION: constipation  . Citalopram Hydrobromide   . Hydrocodone Nausea Only  . Ibandronate Sodium     REACTION: gi upset  . Mirtazapine    Current Outpatient Prescriptions on File Prior to Visit  Medication Sig Dispense Refill  . azelastine (ASTELIN) 137 MCG/SPRAY nasal spray Place 2 sprays into the nose 2 (two) times daily. Use in each nostril as directed    . Calcium Carbonate Antacid (TUMS PO) Take by mouth 3 (three) times daily as needed.      . Cholecalciferol (VITAMIN D) 1000 UNITS capsule Take 1,000 Units by mouth daily.      . clotrimazole-betamethasone (LOTRISONE) cream APPLY TOPICALLY TWICE DAILY as needed 30 g 1  . diphenhydrAMINE (BENADRYL) 25 MG tablet 1-2 tabs by mouth every 6 hrs as needed for itching or swelling 60 tablet 0  . fluticasone (FLOVENT HFA) 44 MCG/ACT inhaler Inhale 2 puffs into the lungs 2 (two) times daily.      . hydrOXYzine (ATARAX/VISTARIL) 25 MG tablet Take 1 tablet (25 mg total) by mouth 3 (three) times daily as needed. 60 tablet 0  . levalbuterol (XOPENEX) 1.25 MG/3ML nebulizer solution Take 1 ampule by  nebulization every 4 (four) hours as needed.      Marland Kitchen levocetirizine (XYZAL) 5 MG tablet Take 5 mg by mouth daily.      Marland Kitchen NEXIUM 40 MG capsule TAKE ONE CAPSULE TWICE A DAY 60 capsule 10  . ondansetron (ZOFRAN) 4 MG tablet Take 1/2-1 tab every 8 hours prn nausea 30 tablet 2  . polyethylene glycol (MIRALAX) powder Take 17 g by mouth. 17 gm in water by mouth once daily as needed    . predniSONE (DELTASONE) 10 MG tablet 3 tabs by mouth per day for 3 days,2tabs per day for 3 days,1tab per day for 3 days 18 tablet 0  . sertraline (ZOLOFT) 50 MG tablet TAKE 1 TABLET EVERY DAY 30 tablet 5  . traMADol (ULTRAM) 50 MG tablet  TAKE 1 TABLET BY MOUTH EVERY 6 HOURS AS NEEDED FOR PAIN 60 tablet 3  . triamcinolone cream (KENALOG) 0.1 % APPLY TO AFFECTED AREA TWICE A DAY 80 g 6   No current facility-administered medications on file prior to visit.   Review of Systems  Constitutional: Negative for unusual diaphoresis or other sweats  HENT: Negative for ringing in ear Eyes: Negative for double vision or worsening visual disturbance.  Respiratory: Negative for choking and stridor.   Gastrointestinal: Negative for vomiting or other signifcant bowel change Genitourinary: Negative for hematuria or decreased urine volume.  Musculoskeletal: Negative for other MSK pain or swelling Skin: Negative for color change and worsening wound.  Neurological: Negative for tremors and numbness other than noted  Psychiatric/Behavioral: Negative for decreased concentration or agitation other than above       Objective:   Physical Exam BP 112/72 mmHg  Pulse 68  Temp(Src) 98.2 F (36.8 C) (Oral)  SpO2 96% VS noted,  Constitutional: Pt appears well-developed, well-nourished.  HENT: Head: NCAT.  Right Ear: External ear normal.  Left Ear: External ear normal.  Eyes: . Pupils are equal, round, and reactive to light. Conjunctivae and EOM are normal Neck: Normal range of motion. Neck supple.  Cardiovascular: Normal rate and regular rhythm.   Pulmonary/Chest: Effort normal and breath sounds without rales or wheezing.  Abd:  Soft, NT, ND, + BS Neurological: Pt is alert. Not confused , motor grossly intact Skin: Skin is warm. No rash Psychiatric: Pt behavior is normal. No agitation.mild nervous     Assessment & Plan:

## 2014-11-25 NOTE — Progress Notes (Signed)
Pre visit review using our clinic review tool, if applicable. No additional management support is needed unless otherwise documented below in the visit note. 

## 2014-11-25 NOTE — Patient Instructions (Signed)
Please continue all other medications as before, and refills have been done if requested.  Please have the pharmacy call with any other refills you may need.  Please keep your appointments with your specialists as you may have planned  Please return in May 2016

## 2014-11-26 NOTE — Assessment & Plan Note (Signed)
stable overall by history and exam, and pt to continue medical treatment as before,  to f/u any worsening symptoms or concerns 

## 2014-11-26 NOTE — Assessment & Plan Note (Signed)
C/w allergic rash, now resolved with depomedrol IM and predpac, no need further tx at this time

## 2014-11-26 NOTE — Assessment & Plan Note (Signed)
No off ACEI though possibly the cause of the rash last wk, BP overall stable overall by history and exam, recent data reviewed with pt, and pt to continue medical treatment -no need add further med for now,  to f/u any worsening symptoms or concerns  BP Readings from Last 3 Encounters:  11/25/14 112/72  11/19/14 120/82  09/15/14 124/68

## 2014-11-26 NOTE — Assessment & Plan Note (Signed)
Improved with resolution of rash, Please continue all other medications as before

## 2014-12-08 ENCOUNTER — Ambulatory Visit (INDEPENDENT_AMBULATORY_CARE_PROVIDER_SITE_OTHER): Payer: Medicare Other | Admitting: Internal Medicine

## 2014-12-08 ENCOUNTER — Encounter: Payer: Self-pay | Admitting: Internal Medicine

## 2014-12-08 VITALS — BP 120/82 | HR 74 | Temp 98.4°F

## 2014-12-08 DIAGNOSIS — I10 Essential (primary) hypertension: Secondary | ICD-10-CM

## 2014-12-08 DIAGNOSIS — R062 Wheezing: Secondary | ICD-10-CM

## 2014-12-08 DIAGNOSIS — J209 Acute bronchitis, unspecified: Secondary | ICD-10-CM

## 2014-12-08 MED ORDER — METHYLPREDNISOLONE ACETATE 80 MG/ML IJ SUSP
80.0000 mg | Freq: Once | INTRAMUSCULAR | Status: AC
  Administered 2014-12-08: 80 mg via INTRAMUSCULAR

## 2014-12-08 MED ORDER — HYDROCODONE-HOMATROPINE 5-1.5 MG/5ML PO SYRP: 5.0000 mL | ORAL_SOLUTION | Freq: Four times a day (QID) | ORAL | Status: DC | PRN

## 2014-12-08 MED ORDER — LEVOFLOXACIN 250 MG PO TABS: 250.0000 mg | ORAL_TABLET | Freq: Every day | ORAL | Status: DC

## 2014-12-08 MED ORDER — PREDNISONE 10 MG PO TABS: ORAL_TABLET | ORAL | Status: DC

## 2014-12-08 NOTE — Assessment & Plan Note (Signed)
stable overall by history and exam, recent data reviewed with pt, and pt to continue medical treatment as before,  to f/u any worsening symptoms or concerns BP Readings from Last 3 Encounters:  12/08/14 120/82  11/25/14 112/72  11/19/14 120/82

## 2014-12-08 NOTE — Assessment & Plan Note (Addendum)
Most likely c/w bronchospasm mild related to current illness, declines cxr, for depomedrol IM, levaquin asd, predpac asd, cough med prn,  to f/u any worsening symptoms or concerns, declines inhaler, has xopenex

## 2014-12-08 NOTE — Addendum Note (Signed)
Addended by: Scharlene Gloss B on: 12/08/2014 03:07 PM   Modules accepted: Orders

## 2014-12-08 NOTE — Progress Notes (Signed)
Pre visit review using our clinic review tool, if applicable. No additional management support is needed unless otherwise documented below in the visit note. 

## 2014-12-08 NOTE — Patient Instructions (Addendum)
You had the steroid shot today  Please take all new medication as prescribed -the antibiotic, cough med, prednisone as directed  Please continue all other medications as before, and refills have been done if requested.  Please have the pharmacy call with any other refills you may need.  Please keep your appointments with your specialists as you may have planned

## 2014-12-08 NOTE — Assessment & Plan Note (Signed)
Mild to mod, for antibx course,  to f/u any worsening symptoms or concerns 

## 2014-12-08 NOTE — Progress Notes (Signed)
Subjective:    Patient ID: Robin Arroyo, female    DOB: 1918-12-13, 81 y.o.   MRN: [id number]  HPI  Here with acute onset mild to mod 2-3 days ST, HA, general weakness and malaise, with prod cough greenish sputum, but Pt denies chest pain, increased sob or doe, wheezing, orthopnea, PND, increased LE swelling, palpitations, dizziness or syncope, except for onset mild wheezing last 2 days with sob, non prod cough, and hard to sleep last PM due to both. Remains unable to stand well, declines cxr today Also severe HOH but o/w competant., Past Medical History  Diagnosis Date  . Unspecified vitamin D deficiency 07/20/2009  . HYPERLIPIDEMIA 12/11/2007  . ANXIETY 12/11/2007  . INSOMNIA-SLEEP DISORDER-UNSPEC 12/11/2007  . DEPRESSION 12/11/2007  . HYPERTENSION 12/11/2007  . ALLERGIC RHINITIS 12/11/2007  . ASTHMA 12/11/2007  . GERD 12/11/2007  . HIATAL HERNIA 08/17/2010  . DIVERTICULOSIS, COLON 12/11/2007  . ARTHRITIS, RHEUMATOID 12/11/2007  . OSTEOARTHRITIS, KNEES, BILATERAL 01/18/2010  . OSTEOPOROSIS 12/11/2007  . INSOMNIA-SLEEP DISORDER-UNSPEC 06/08/2008  . FATIGUE 12/11/2007  . SKIN RASH 05/11/2008  . CONTUSION, LOWER LEG, RIGHT 01/19/2009  . COLONIC POLYPS, HYPERPLASTIC, HX OF 08/17/2010  . DERMATITIS FACTITIA 01/10/2011  . PRURITUS 01/10/2011  . CHEST PAIN 01/15/2011  . Single kidney     functional   . Lumbar spine pain     DJD  . Hearing loss     hearing aids  . Vitamin D deficiency   . Acromial fracture 04/17/2011   Past Surgical History  Procedure Laterality Date  . Abdominal hysterectomy    . Cholecystectomy    . S/p bilateral knee replacements    . S/p bladder surgery    . Appendectomy    . Tonsillectomy      reports that she has quit smoking. She does not have any smokeless tobacco history on file. She reports that she does not drink alcohol or use illicit drugs. family history includes Breast cancer in her mother and sister; Cancer in her father; Diabetes in her sister; Prostate cancer in her  brother, brother, and father; Stomach cancer in her sister. There is no history of Colon cancer. Allergies  Allergen Reactions  . Ace Inhibitors Other (See Comments)    rash  . Alendronate Sodium     REACTION: constipation  . Citalopram Hydrobromide   . Hydrocodone Nausea Only  . Ibandronate Sodium     REACTION: gi upset  . Mirtazapine    Current Outpatient Prescriptions on File Prior to Visit  Medication Sig Dispense Refill  . azelastine (ASTELIN) 137 MCG/SPRAY nasal spray Place 2 sprays into the nose 2 (two) times daily. Use in each nostril as directed    . Calcium Carbonate Antacid (TUMS PO) Take by mouth 3 (three) times daily as needed.      . Cholecalciferol (VITAMIN D) 1000 UNITS capsule Take 1,000 Units by mouth daily.      . clotrimazole-betamethasone (LOTRISONE) cream APPLY TOPICALLY TWICE DAILY as needed 30 g 1  . diphenhydrAMINE (BENADRYL) 25 MG tablet 1-2 tabs by mouth every 6 hrs as needed for itching or swelling 60 tablet 0  . fluticasone (FLOVENT HFA) 44 MCG/ACT inhaler Inhale 2 puffs into the lungs 2 (two) times daily.      . hydrOXYzine (ATARAX/VISTARIL) 25 MG tablet Take 1 tablet (25 mg total) by mouth 3 (three) times daily as needed. 60 tablet 0  . levalbuterol (XOPENEX) 1.25 MG/3ML nebulizer solution Take 1 ampule by nebulization every 4 (four) hours  as needed.      Marland Kitchen levocetirizine (XYZAL) 5 MG tablet Take 5 mg by mouth daily.      Marland Kitchen NEXIUM 40 MG capsule TAKE ONE CAPSULE TWICE A DAY 60 capsule 10  . ondansetron (ZOFRAN) 4 MG tablet Take 1/2-1 tab every 8 hours prn nausea 30 tablet 2  . polyethylene glycol (MIRALAX) powder Take 17 g by mouth. 17 gm in water by mouth once daily as needed    . predniSONE (DELTASONE) 10 MG tablet 3 tabs by mouth per day for 3 days,2tabs per day for 3 days,1tab per day for 3 days 18 tablet 0  . sertraline (ZOLOFT) 50 MG tablet TAKE 1 TABLET EVERY DAY 30 tablet 5  . traMADol (ULTRAM) 50 MG tablet TAKE 1 TABLET BY MOUTH EVERY 6 HOURS AS  NEEDED FOR PAIN 60 tablet 3  . triamcinolone cream (KENALOG) 0.1 % APPLY TO AFFECTED AREA TWICE A DAY 80 g 6   No current facility-administered medications on file prior to visit.   Review of Systems  Constitutional: Negative for unusual diaphoresis or other sweats  HENT: Negative for ringing in ear Eyes: Negative for double vision or worsening visual disturbance.  Respiratory: Negative for choking and stridor.   Gastrointestinal: Negative for vomiting or other signifcant bowel change Genitourinary: Negative for hematuria or decreased urine volume.  Musculoskeletal: Negative for other MSK pain or swelling Skin: Negative for color change and worsening wound.  Neurological: Negative for tremors and numbness other than noted  Psychiatric/Behavioral: Negative for decreased concentration or agitation other than above   \    Objective:   Physical Exam BP 120/82 mmHg  Pulse 74  Temp(Src) 98.4 F (36.9 C) (Oral)  SpO2 97% VS noted, mild ill Constitutional: Pt appears well-developed, well-nourished.  HENT: Head: NCAT.  Right Ear: External ear normal.  Left Ear: External ear normal.  Eyes: . Pupils are equal, round, and reactive to light. Conjunctivae and EOM are normal Bilat tm's with mild erythema.  Max sinus areas non tender.  Pharynx with mild erythema, no exudate Neck: Normal range of motion. Neck supple.  Cardiovascular: Normal rate and regular rhythm.   Pulmonary/Chest: Effort normal and decreased breath sounds without rales but with insp and mild exp wheezing.  Neurological: Pt is alert. Not confused , motor grossly intact Skin: Skin is warm. No rash Psychiatric: Pt behavior is normal. No agitation.     Assessment & Plan:

## 2014-12-23 ENCOUNTER — Telehealth: Payer: Self-pay | Admitting: Internal Medicine

## 2014-12-23 MED ORDER — TRAMADOL HCL 50 MG PO TABS: 50.0000 mg | ORAL_TABLET | Freq: Four times a day (QID) | ORAL | Status: DC | PRN

## 2014-12-23 NOTE — Telephone Encounter (Signed)
Done hardcopy to rachel  

## 2014-12-24 NOTE — Telephone Encounter (Signed)
faxed

## 2015-02-02 DIAGNOSIS — S80822D Blister (nonthermal), left lower leg, subsequent encounter: Secondary | ICD-10-CM

## 2015-03-16 ENCOUNTER — Ambulatory Visit: Payer: Medicare Other | Admitting: Internal Medicine

## 2015-03-29 ENCOUNTER — Ambulatory Visit: Payer: Medicare Other | Admitting: Internal Medicine

## 2015-06-16 ENCOUNTER — Other Ambulatory Visit: Payer: Self-pay | Admitting: Internal Medicine

## 2015-06-16 DIAGNOSIS — I10 Essential (primary) hypertension: Secondary | ICD-10-CM

## 2015-06-16 DIAGNOSIS — J4520 Mild intermittent asthma, uncomplicated: Secondary | ICD-10-CM

## 2015-06-16 DIAGNOSIS — M069 Rheumatoid arthritis, unspecified: Secondary | ICD-10-CM

## 2015-06-16 NOTE — Telephone Encounter (Signed)
Referral done - for [facility] to see pt

## 2015-06-30 ENCOUNTER — Telehealth: Payer: Self-pay | Admitting: Internal Medicine

## 2015-06-30 NOTE — Telephone Encounter (Signed)
Dahlia to contact family/pt -   We received information that Home Health services are not available by any agency for the patient's current home address  I dont have anything else to offer unless the family wants to try Outpatient PT if this can be arranged

## 2015-06-30 NOTE — Telephone Encounter (Signed)
Pt's daughter advised in detail via personal VM

## 2015-07-12 ENCOUNTER — Other Ambulatory Visit: Payer: Self-pay

## 2015-07-12 MED ORDER — TRAMADOL HCL 50 MG PO TABS: 50.0000 mg | ORAL_TABLET | Freq: Four times a day (QID) | ORAL | Status: DC | PRN

## 2015-07-12 NOTE — Telephone Encounter (Signed)
Rx faxed to pharmacy  

## 2015-07-12 NOTE — Telephone Encounter (Signed)
Done hardcopy to dahlia/LIM B  

## 2015-07-24 ENCOUNTER — Encounter (HOSPITAL_COMMUNITY): Payer: Self-pay | Admitting: Emergency Medicine

## 2015-07-24 ENCOUNTER — Inpatient Hospital Stay (HOSPITAL_COMMUNITY)
Admission: EM | Admit: 2015-07-24 | Discharge: 2015-07-28 | DRG: 603 | Disposition: A | Discharge status: Skilled Nursing Facility | Payer: Medicare Other | Attending: Internal Medicine | Admitting: Internal Medicine

## 2015-07-24 ENCOUNTER — Emergency Department (HOSPITAL_COMMUNITY): Payer: Medicare Other

## 2015-07-24 DIAGNOSIS — S82891S Other fracture of right lower leg, sequela: Secondary | ICD-10-CM | POA: Diagnosis not present

## 2015-07-24 DIAGNOSIS — Z66 Do not resuscitate: Secondary | ICD-10-CM | POA: Diagnosis present

## 2015-07-24 DIAGNOSIS — A4901 Methicillin susceptible Staphylococcus aureus infection, unspecified site: Secondary | ICD-10-CM | POA: Diagnosis present

## 2015-07-24 DIAGNOSIS — Z833 Family history of diabetes mellitus: Secondary | ICD-10-CM

## 2015-07-24 DIAGNOSIS — S82891A Other fracture of right lower leg, initial encounter for closed fracture: Secondary | ICD-10-CM

## 2015-07-24 DIAGNOSIS — Z888 Allergy status to other drugs, medicaments and biological substances status: Secondary | ICD-10-CM

## 2015-07-24 DIAGNOSIS — W228XXA Striking against or struck by other objects, initial encounter: Secondary | ICD-10-CM | POA: Diagnosis present

## 2015-07-24 DIAGNOSIS — L03115 Cellulitis of right lower limb: Secondary | ICD-10-CM | POA: Diagnosis present

## 2015-07-24 DIAGNOSIS — S82891D Other fracture of right lower leg, subsequent encounter for closed fracture with routine healing: Secondary | ICD-10-CM | POA: Diagnosis not present

## 2015-07-24 DIAGNOSIS — H9190 Unspecified hearing loss, unspecified ear: Secondary | ICD-10-CM | POA: Diagnosis present

## 2015-07-24 DIAGNOSIS — R238 Other skin changes: Secondary | ICD-10-CM | POA: Diagnosis present

## 2015-07-24 DIAGNOSIS — Z800 Family history of malignant neoplasm of digestive organs: Secondary | ICD-10-CM

## 2015-07-24 DIAGNOSIS — B9561 Methicillin susceptible Staphylococcus aureus infection as the cause of diseases classified elsewhere: Secondary | ICD-10-CM | POA: Diagnosis present

## 2015-07-24 DIAGNOSIS — K219 Gastro-esophageal reflux disease without esophagitis: Secondary | ICD-10-CM | POA: Diagnosis present

## 2015-07-24 DIAGNOSIS — E785 Hyperlipidemia, unspecified: Secondary | ICD-10-CM | POA: Diagnosis present

## 2015-07-24 DIAGNOSIS — F32A Depression, unspecified: Secondary | ICD-10-CM | POA: Diagnosis present

## 2015-07-24 DIAGNOSIS — D649 Anemia, unspecified: Secondary | ICD-10-CM | POA: Diagnosis present

## 2015-07-24 DIAGNOSIS — Z803 Family history of malignant neoplasm of breast: Secondary | ICD-10-CM | POA: Diagnosis not present

## 2015-07-24 DIAGNOSIS — S82844A Nondisplaced bimalleolar fracture of right lower leg, initial encounter for closed fracture: Secondary | ICD-10-CM | POA: Diagnosis present

## 2015-07-24 DIAGNOSIS — M810 Age-related osteoporosis without current pathological fracture: Secondary | ICD-10-CM | POA: Diagnosis present

## 2015-07-24 DIAGNOSIS — M1990 Unspecified osteoarthritis, unspecified site: Secondary | ICD-10-CM | POA: Diagnosis present

## 2015-07-24 DIAGNOSIS — Z96653 Presence of artificial knee joint, bilateral: Secondary | ICD-10-CM | POA: Diagnosis present

## 2015-07-24 DIAGNOSIS — J309 Allergic rhinitis, unspecified: Secondary | ICD-10-CM

## 2015-07-24 DIAGNOSIS — I10 Essential (primary) hypertension: Secondary | ICD-10-CM

## 2015-07-24 DIAGNOSIS — Z87891 Personal history of nicotine dependence: Secondary | ICD-10-CM | POA: Diagnosis not present

## 2015-07-24 DIAGNOSIS — M069 Rheumatoid arthritis, unspecified: Secondary | ICD-10-CM | POA: Diagnosis present

## 2015-07-24 DIAGNOSIS — J45909 Unspecified asthma, uncomplicated: Secondary | ICD-10-CM | POA: Diagnosis present

## 2015-07-24 DIAGNOSIS — Z993 Dependence on wheelchair: Secondary | ICD-10-CM

## 2015-07-24 DIAGNOSIS — F329 Major depressive disorder, single episode, unspecified: Secondary | ICD-10-CM | POA: Diagnosis present

## 2015-07-24 HISTORY — DX: Other fracture of right lower leg, initial encounter for closed fracture: S82.891A

## 2015-07-24 HISTORY — DX: Cellulitis of right lower limb: L03.115

## 2015-07-24 HISTORY — DX: Methicillin susceptible Staphylococcus aureus infection, unspecified site: A49.01

## 2015-07-24 LAB — CBC WITH DIFFERENTIAL/PLATELET
BASOS ABS: 0 10*3/uL (ref 0.0–0.1)
Basophils Relative: 0 %
EOS ABS: 0.2 10*3/uL (ref 0.0–0.7)
EOS PCT: 2 %
HCT: 36.2 % (ref 36.0–46.0)
Hemoglobin: 11.6 g/dL — ABNORMAL LOW (ref 12.0–15.0)
Lymphocytes Relative: 17 %
Lymphs Abs: 1.7 10*3/uL (ref 0.7–4.0)
MCH: 30.1 pg (ref 26.0–34.0)
MCHC: 32 g/dL (ref 30.0–36.0)
MCV: 94 fL (ref 78.0–100.0)
MONO ABS: 1 10*3/uL (ref 0.1–1.0)
Monocytes Relative: 10 %
Neutro Abs: 7.1 10*3/uL (ref 1.7–7.7)
Neutrophils Relative %: 71 %
PLATELETS: 344 10*3/uL (ref 150–400)
RBC: 3.85 MIL/uL — AB (ref 3.87–5.11)
RDW: 13 % (ref 11.5–15.5)
WBC: 10 10*3/uL (ref 4.0–10.5)

## 2015-07-24 LAB — COMPREHENSIVE METABOLIC PANEL
ALT: 9 U/L — AB (ref 14–54)
AST: 17 U/L (ref 15–41)
Albumin: 3 g/dL — ABNORMAL LOW (ref 3.5–5.0)
Alkaline Phosphatase: 41 U/L (ref 38–126)
Anion gap: 5 (ref 5–15)
BUN: 12 mg/dL (ref 6–20)
CHLORIDE: 101 mmol/L (ref 101–111)
CO2: 32 mmol/L (ref 22–32)
CREATININE: 0.78 mg/dL (ref 0.44–1.00)
Calcium: 9 mg/dL (ref 8.9–10.3)
GFR calc non Af Amer: >60 mL/min (ref >60)
Glucose, Bld: 97 mg/dL (ref 65–99)
POTASSIUM: 4.1 mmol/L (ref 3.5–5.1)
SODIUM: 138 mmol/L (ref 135–145)
Total Bilirubin: 0.5 mg/dL (ref 0.3–1.2)
Total Protein: 6.7 g/dL (ref 6.5–8.1)

## 2015-07-24 MED ORDER — MORPHINE SULFATE (PF) 2 MG/ML IV SOLN
1.0000 mg | INTRAVENOUS | Status: DC | PRN
  Administered 2015-07-25: 1 mg via INTRAVENOUS
  Filled 2015-07-24: qty 1

## 2015-07-24 MED ORDER — ONDANSETRON HCL 4 MG PO TABS: 4.0000 mg | ORAL_TABLET | Freq: Four times a day (QID) | ORAL | Status: DC | PRN

## 2015-07-24 MED ORDER — VITAMIN D 1000 UNITS PO TABS
1000.0000 [IU] | ORAL_TABLET | Freq: Every day | ORAL | Status: DC
  Administered 2015-07-25 – 2015-07-28 (×4): 1000 [IU] via ORAL
  Filled 2015-07-24 (×9): qty 1

## 2015-07-24 MED ORDER — BUDESONIDE 0.25 MG/2ML IN SUSP
0.2500 mg | Freq: Two times a day (BID) | RESPIRATORY_TRACT | Status: DC
  Administered 2015-07-25 – 2015-07-28 (×7): 0.25 mg via RESPIRATORY_TRACT
  Filled 2015-07-24 (×10): qty 2

## 2015-07-24 MED ORDER — PANTOPRAZOLE SODIUM 40 MG PO TBEC
80.0000 mg | DELAYED_RELEASE_TABLET | Freq: Every day | ORAL | Status: DC
  Administered 2015-07-25 – 2015-07-28 (×4): 80 mg via ORAL
  Filled 2015-07-24 (×4): qty 2

## 2015-07-24 MED ORDER — DOCUSATE SODIUM 100 MG PO CAPS
100.0000 mg | ORAL_CAPSULE | Freq: Two times a day (BID) | ORAL | Status: DC
  Administered 2015-07-24 – 2015-07-28 (×7): 100 mg via ORAL
  Filled 2015-07-24 (×9): qty 1

## 2015-07-24 MED ORDER — TRAMADOL HCL 50 MG PO TABS
50.0000 mg | ORAL_TABLET | Freq: Four times a day (QID) | ORAL | Status: DC | PRN
Start: 2015-07-24 — End: 2015-07-28

## 2015-07-24 MED ORDER — POTASSIUM CHLORIDE IN NACL 20-0.9 MEQ/L-% IV SOLN
INTRAVENOUS | Status: DC
  Administered 2015-07-24: 16:00:00 via INTRAVENOUS

## 2015-07-24 MED ORDER — SERTRALINE HCL 50 MG PO TABS
50.0000 mg | ORAL_TABLET | Freq: Every day | ORAL | Status: DC
  Administered 2015-07-25 – 2015-07-28 (×4): 50 mg via ORAL
  Filled 2015-07-24 (×4): qty 1

## 2015-07-24 MED ORDER — AZELASTINE HCL 0.1 % NA SOLN
2.0000 | Freq: Two times a day (BID) | NASAL | Status: DC
  Administered 2015-07-24 – 2015-07-28 (×7): 2 via NASAL
  Filled 2015-07-24: qty 30

## 2015-07-24 MED ORDER — VANCOMYCIN HCL 500 MG IV SOLR
500.0000 mg | INTRAVENOUS | Status: DC
  Administered 2015-07-25 – 2015-07-26 (×2): 500 mg via INTRAVENOUS
  Filled 2015-07-24 (×3): qty 500

## 2015-07-24 MED ORDER — ACETAMINOPHEN 650 MG RE SUPP: 650.0000 mg | Freq: Four times a day (QID) | RECTAL | Status: DC | PRN

## 2015-07-24 MED ORDER — GUAIFENESIN-DM 100-10 MG/5ML PO SYRP
5.0000 mL | ORAL_SOLUTION | ORAL | Status: DC | PRN
  Filled 2015-07-24: qty 5

## 2015-07-24 MED ORDER — LEVOCETIRIZINE DIHYDROCHLORIDE 5 MG PO TABS: 5.0000 mg | ORAL_TABLET | Freq: Every day | ORAL | Status: DC

## 2015-07-24 MED ORDER — LORATADINE 10 MG PO TABS
10.0000 mg | ORAL_TABLET | Freq: Every day | ORAL | Status: DC
  Administered 2015-07-25 – 2015-07-28 (×4): 10 mg via ORAL
  Filled 2015-07-24 (×4): qty 1

## 2015-07-24 MED ORDER — FAMOTIDINE 20 MG PO TABS
20.0000 mg | ORAL_TABLET | Freq: Two times a day (BID) | ORAL | Status: DC
  Administered 2015-07-24 – 2015-07-28 (×9): 20 mg via ORAL
  Filled 2015-07-24 (×9): qty 1

## 2015-07-24 MED ORDER — POLYETHYLENE GLYCOL 3350 17 G PO PACK: 17.0000 g | PACK | Freq: Every day | ORAL | Status: DC | PRN

## 2015-07-24 MED ORDER — BUDESONIDE 0.25 MG/2ML IN SUSP
RESPIRATORY_TRACT | Status: AC
  Filled 2015-07-24: qty 2

## 2015-07-24 MED ORDER — LEVALBUTEROL HCL 1.25 MG/3ML IN NEBU
1.2500 mg | INHALATION_SOLUTION | RESPIRATORY_TRACT | Status: DC | PRN
  Filled 2015-07-24: qty 3

## 2015-07-24 MED ORDER — ONDANSETRON HCL 4 MG/2ML IJ SOLN: 4.0000 mg | Freq: Four times a day (QID) | INTRAMUSCULAR | Status: DC | PRN

## 2015-07-24 MED ORDER — ACETAMINOPHEN 325 MG PO TABS
650.0000 mg | ORAL_TABLET | Freq: Four times a day (QID) | ORAL | Status: DC | PRN
  Administered 2015-07-25: 650 mg via ORAL
  Filled 2015-07-24: qty 2

## 2015-07-24 MED ORDER — VANCOMYCIN HCL IN DEXTROSE 1-5 GM/200ML-% IV SOLN
1000.0000 mg | Freq: Once | INTRAVENOUS | Status: AC
  Administered 2015-07-24: 1000 mg via INTRAVENOUS
  Filled 2015-07-24: qty 200

## 2015-07-24 MED ORDER — HEPARIN SODIUM (PORCINE) 5000 UNIT/ML IJ SOLN
5000.0000 [IU] | Freq: Three times a day (TID) | INTRAMUSCULAR | Status: DC
  Administered 2015-07-24 – 2015-07-28 (×12): 5000 [IU] via SUBCUTANEOUS
  Filled 2015-07-24 (×12): qty 1

## 2015-07-24 MED ORDER — POLYETHYLENE GLYCOL 3350 17 GM/SCOOP PO POWD
17.0000 g | Freq: Every day | ORAL | Status: DC | PRN
  Filled 2015-07-24: qty 255

## 2015-07-24 NOTE — Progress Notes (Addendum)
Patient refused neb. Patient is very hard of hearing 81 year old female. She states she has acid reflux and needs to sleep and can she start tomorrow.

## 2015-07-24 NOTE — ED Provider Notes (Signed)
CSN: [id number]     Arrival date & time 07/24/15  1136 History  This chart was scribed for Donnetta Hutching, MD by Ronney Lion, ED Scribe. This patient was seen in room APA01/APA01 and the patient's care was started at 12:01 PM.    Chief Complaint  Patient presents with  . Cellulitis   The history is provided by the patient and a relative. The history is limited by the condition of the patient. No language interpreter was used.   LEVEL 5 CAVEAT DUE TO DEMENTIA  HPI Comments: Robin Arroyo is a 81 y.o. female who lives in an assisted living facility who presents to the Emergency Department complaining of redness, pain, and swelling after sustaining a wound to her right medial ankle that she thinks might have been from accidentally scratching it while in her power chair.  Her granddaughter is visiting from out of town and noticed it today. Patient has not had any treatment for this.   Past Medical History  Diagnosis Date  . Unspecified vitamin D deficiency 07/20/2009  . HYPERLIPIDEMIA 12/11/2007  . ANXIETY 12/11/2007  . INSOMNIA-SLEEP DISORDER-UNSPEC 12/11/2007  . DEPRESSION 12/11/2007  . HYPERTENSION 12/11/2007  . ALLERGIC RHINITIS 12/11/2007  . ASTHMA 12/11/2007  . GERD 12/11/2007  . HIATAL HERNIA 08/17/2010  . DIVERTICULOSIS, COLON 12/11/2007  . ARTHRITIS, RHEUMATOID 12/11/2007  . OSTEOARTHRITIS, KNEES, BILATERAL 01/18/2010  . OSTEOPOROSIS 12/11/2007  . INSOMNIA-SLEEP DISORDER-UNSPEC 06/08/2008  . FATIGUE 12/11/2007  . SKIN RASH 05/11/2008  . CONTUSION, LOWER LEG, RIGHT 01/19/2009  . COLONIC POLYPS, HYPERPLASTIC, HX OF 08/17/2010  . DERMATITIS FACTITIA 01/10/2011  . PRURITUS 01/10/2011  . CHEST PAIN 01/15/2011  . Single kidney     functional   . Lumbar spine pain     DJD  . Hearing loss     hearing aids  . Vitamin D deficiency   . Acromial fracture 04/17/2011   Past Surgical History  Procedure Laterality Date  . Abdominal hysterectomy    . Cholecystectomy    . S/p bilateral knee replacements    . S/p  bladder surgery    . Appendectomy    . Tonsillectomy     Family History  Problem Relation Age of Onset  . Breast cancer Mother   . Prostate cancer Father   . Breast cancer Sister   . Diabetes Sister   . Prostate cancer Brother   . Cancer Father     bladder  . Prostate cancer Brother   . Colon cancer Neg Hx   . Stomach cancer Sister    Social History  Substance Use Topics  . Smoking status: Former Games developer  . Smokeless tobacco: None     Comment: smoked only a few months many years ago  . Alcohol Use: No   OB History    No data available     Review of Systems  Unable to perform ROS: Dementia    Allergies  Ace inhibitors; Alendronate sodium; Citalopram hydrobromide; Hydrocodone; Ibandronate sodium; and Mirtazapine  Home Medications   Prior to Admission medications   Medication Sig Start Date End Date Taking? Authorizing Provider  acetaminophen (TYLENOL) 500 MG tablet Take 1,000 mg by mouth every 6 (six) hours as needed for mild pain, fever or headache (fever up to 101 degrees).   Yes Historical Provider, MD  azelastine (ASTELIN) 137 MCG/SPRAY nasal spray Place 2 sprays into the nose 2 (two) times daily. Use in each nostril as directed   Yes Historical Provider, MD  Cholecalciferol (VITAMIN D)  1000 UNITS capsule Take 1,000 Units by mouth daily.     Yes Historical Provider, MD  fluticasone (FLOVENT HFA) 44 MCG/ACT inhaler Inhale 2 puffs into the lungs 2 (two) times daily.     Yes Historical Provider, MD  levocetirizine (XYZAL) 5 MG tablet Take 5 mg by mouth daily.     Yes Historical Provider, MD  NEXIUM 40 MG capsule TAKE ONE CAPSULE TWICE A DAY Patient taking differently: TAKE ONE TABLET BY MOUTH DAILY 04/04/13  Yes Corwin Levins, MD  ranitidine (ZANTAC) 75 MG tablet Take 75 mg by mouth 2 (two) times daily.   Yes Historical Provider, MD  sertraline (ZOLOFT) 50 MG tablet TAKE 1 TABLET EVERY DAY 09/07/13  Yes Corwin Levins, MD  clotrimazole-betamethasone (LOTRISONE) cream APPLY  TOPICALLY TWICE DAILY as needed Patient not taking: Reported on 07/24/2015 03/17/14   Corwin Levins, MD  diphenhydrAMINE (BENADRYL) 25 MG tablet 1-2 tabs by mouth every 6 hrs as needed for itching or swelling Patient not taking: Reported on 07/24/2015 11/19/14   Corwin Levins, MD  HYDROcodone-homatropine [facility]) 5-1.5 MG/5ML syrup Take 5 mLs by mouth every 6 (six) hours as needed for cough. Patient not taking: Reported on 07/24/2015 12/08/14   Corwin Levins, MD  hydrOXYzine (ATARAX/VISTARIL) 25 MG tablet Take 1 tablet (25 mg total) by mouth 3 (three) times daily as needed. Patient not taking: Reported on 07/24/2015 11/19/14   Corwin Levins, MD  levalbuterol [facility]) 1.25 MG/3ML nebulizer solution Take 1 ampule by nebulization every 4 (four) hours as needed for shortness of breath.     Historical Provider, MD  levofloxacin (LEVAQUIN) 250 MG tablet Take 1 tablet (250 mg total) by mouth daily. Patient not taking: Reported on 07/24/2015 12/08/14   Corwin Levins, MD  ondansetron [facility]) 4 MG tablet Take 1/2-1 tab every 8 hours prn nausea Patient taking differently: Take 4 mg by mouth every 8 (eight) hours as needed for nausea or vomiting.  03/17/14   Corwin Levins, MD  polyethylene glycol [facility]) powder Take 17 g by mouth daily as needed for mild constipation. 17 gm in water by mouth once daily as needed    Historical Provider, MD  predniSONE (DELTASONE) 10 MG tablet 3 tabs by mouth per day for 3 days,2tabs per day for 3 days,1tab per day for 3 days Patient not taking: Reported on 07/24/2015 12/08/14   Corwin Levins, MD  traMADol (ULTRAM) 50 MG tablet Take 1 tablet (50 mg total) by mouth every 6 (six) hours as needed. for pain 07/12/15   Corwin Levins, MD  triamcinolone cream (KENALOG) 0.1 % APPLY TO AFFECTED AREA TWICE A DAY Patient not taking: Reported on 07/24/2015 03/17/14   Corwin Levins, MD   BP 177/73 mmHg  Pulse 70  Temp(Src) 98.3 F (36.8 C) (Oral)  Resp 18  Ht 5\' 5"  (1.651 m)  Wt 130 lb (58.968 kg)  BMI  21.63 kg/m2  SpO2 100% Physical Exam  Constitutional: She is oriented to person, place, and time. She appears well-developed and well-nourished.  HENT:  Head: Normocephalic and atraumatic.  Eyes: Conjunctivae and EOM are normal. Pupils are equal, round, and reactive to light.  Neck: Normal range of motion. Neck supple.  Cardiovascular: Normal rate and regular rhythm.   Pulmonary/Chest: Effort normal and breath sounds normal.  Abdominal: Soft. Bowel sounds are normal.  Musculoskeletal: Normal range of motion. She exhibits edema.  RLE: erythema, edema, and tense bulge, approximately 2 cm in diameter,  in medial aspect of ankle.   Neurological: She is alert and oriented to person, place, and time.  Skin: Skin is warm and dry.  Psychiatric: She has a normal mood and affect. Her behavior is normal.  Nursing note and vitals reviewed.   ED Course  Procedures (including critical care time)  DIAGNOSTIC STUDIES: Oxygen Saturation is 96% on RA, normal by my interpretation.    COORDINATION OF CARE: 12:02 PM - Suspect cellulitis. Discussed treatment plan with pt and her granddaughter at bedside which includes IV antibiotics and and hospital admission. Pt verbalized understanding and agreed to plan.  Labs Review Labs Reviewed  CBC WITH DIFFERENTIAL/PLATELET - Abnormal; Notable for the following:    RBC 3.85 (*)    Hemoglobin 11.6 (*)    All other components within normal limits  COMPREHENSIVE METABOLIC PANEL - Abnormal; Notable for the following:    Albumin 3.0 (*)    ALT 9 (*)    All other components within normal limits  CULTURE, ROUTINE-ABSCESS  CULTURE, ROUTINE-ABSCESS    Imaging Review No results found. I have personally reviewed and evaluated these images and lab results as part of my medical decision-making.   EKG Interpretation None      MDM   Final diagnoses:  Cellulitis of right ankle   history and physical consistent with right ankle/foot cellulitis. Patient is not  septic. IV vancomycin. Plain films of ankle and foot pending. Admit to general medicine.  I personally performed the services described in this documentation, which was scribed in my presence. The recorded information has been reviewed and is accurate.     Donnetta Hutching, MD 07/24/15 1321

## 2015-07-24 NOTE — H&P (Signed)
[facility] History and Physical  Robin Arroyo ZOX:[id number] DOB: Nov 23, 1918 DOA: 07/24/2015  Referring physician: ED PHYSICIAN, Dr. Adriana Simas. PCP: Assisted living M.D.   Chief Complaint: Right ankle pain and swelling.  HPI: Robin Arroyo is a 81 y.o. female with a history of seasonal allergies, HTN, depression with anxiety, DJD, and rheumatoid arthritis. She is practically nonambulatory and gets around in a motorized wheelchair. The history is provided by both the patient and her daughter-in-law, Robin Arroyo. She presents from [facility] facility with a chief complaint of right ankle pain and swelling. Approximately 3 days ago, she scraped  her right ankle on her wheelchair. Robin Arroyo states that the patient has difficulty driving the motorized wheelchair and frequently runs into the wall, scrapes her legs and her arms on the wheelchair, and has on a couple occasions became pinned to the wall by the wheelchair when she attempts to get up from the wheelchair onto the bed or bedside commode. She forgets to pause/break the wheelchair. Patient reports that following the scrape, her right ankle became painful and swollen. It became progressively red and more painful, particularly when she tries to stand. Over the past 24 hours, it began draining yellowish looking fluid which did not have much of an odor. The redness now has migrated from her right ankle to her mid leg. She denies subjective fever, chills, chest pain, chest congestion, nausea, vomiting, or diarrhea. She has had more sleepiness over the past 24 hours.  In the ED, she is moderately hypertensive with a blood pressure 172/75. She is afebrile and otherwise hemodynamically stable. Her lab data are significant for normal W BC of 10.0, mild anemia with a hemoglobin of 11.6, otherwise her laboratory studies are unremarkable. X-ray of her right ankle/foot revealed soft tissue changes and a suspicion of a small avulsion fracture  involving the distal tip of the lateral malleolus and a suspicion of a nondisplaced possibly healing medial malleolus fracture. She is being admitted for further evaluation and management.      Review of Systems:  As above in history present illness. In addition, the patient is very hard of hearing. She has chronic degenerative joint disease with pain in her joints, particular her knees. She has chronic swelling in her legs for which dressings are applied daily. She has some difficulty with her memory according to her daughter-in-law. Otherwise review of systems is negative.  Past Medical History  Diagnosis Date  . Unspecified vitamin D deficiency 07/20/2009  . HYPERLIPIDEMIA 12/11/2007  . ANXIETY 12/11/2007  . INSOMNIA-SLEEP DISORDER-UNSPEC 12/11/2007  . DEPRESSION 12/11/2007  . HYPERTENSION 12/11/2007  . ALLERGIC RHINITIS 12/11/2007  . ASTHMA 12/11/2007  . GERD 12/11/2007  . HIATAL HERNIA 08/17/2010  . DIVERTICULOSIS, COLON 12/11/2007  . ARTHRITIS, RHEUMATOID 12/11/2007  . OSTEOARTHRITIS, KNEES, BILATERAL 01/18/2010  . OSTEOPOROSIS 12/11/2007  . INSOMNIA-SLEEP DISORDER-UNSPEC 06/08/2008  . FATIGUE 12/11/2007  . SKIN RASH 05/11/2008  . CONTUSION, LOWER LEG, RIGHT 01/19/2009  . COLONIC POLYPS, HYPERPLASTIC, HX OF 08/17/2010  . DERMATITIS FACTITIA 01/10/2011  . PRURITUS 01/10/2011  . CHEST PAIN 01/15/2011  . Single kidney     functional   . Lumbar spine pain     DJD  . Hearing loss     hearing aids  . Vitamin D deficiency   . Acromial fracture 04/17/2011   Past Surgical History  Procedure Laterality Date  . Abdominal hysterectomy    . Cholecystectomy    . S/p bilateral knee replacements    . S/p bladder  surgery    . Appendectomy    . Tonsillectomy     Social History: Patient is a resident of [address] assisted living. She is widowed. She has 3 children. Her daughter-in-law, Robin Arroyo is currently present-she is married to the patient's son, Robin Arroyo who is the healthcare power of attorney. Patient is no  longer ambulatory because of her arthritis and gets around in a motorized wheelchair. She has a very remote history of tobacco use and alcohol use, none in greater than 40 years. No history of illicit drug use.   Allergies  Allergen Reactions  . Ace Inhibitors Other (See Comments)    rash  . Alendronate Sodium     REACTION: constipation  . Citalopram Hydrobromide Other (See Comments)    Unknown reaction  . Hydrocodone Nausea Only  . Ibandronate Sodium     REACTION: gi upset  . Mirtazapine Other (See Comments)    Unknown reaction     Family History  Problem Relation Age of Onset  . Breast cancer Mother   . Prostate cancer Father   . Breast cancer Sister   . Diabetes Sister   . Prostate cancer Brother   . Cancer Father     bladder  . Prostate cancer Brother   . Colon cancer Neg Hx   . Stomach cancer Sister     Prior to Admission medications   Medication Sig Start Date End Date Taking? Authorizing Provider  acetaminophen (TYLENOL) 500 MG tablet Take 1,000 mg by mouth every 6 (six) hours as needed for mild pain, fever or headache (fever up to 101 degrees).   Yes Historical Provider, MD  azelastine (ASTELIN) 137 MCG/SPRAY nasal spray Place 2 sprays into the nose 2 (two) times daily. Use in each nostril as directed   Yes Historical Provider, MD  Cholecalciferol (VITAMIN D) 1000 UNITS capsule Take 1,000 Units by mouth daily.     Yes Historical Provider, MD  fluticasone (FLOVENT HFA) 44 MCG/ACT inhaler Inhale 2 puffs into the lungs 2 (two) times daily.     Yes Historical Provider, MD  levocetirizine (XYZAL) 5 MG tablet Take 5 mg by mouth daily.     Yes Historical Provider, MD  NEXIUM 40 MG capsule TAKE ONE CAPSULE TWICE A DAY Patient taking differently: TAKE ONE TABLET BY MOUTH DAILY 04/04/13  Yes Corwin Levins, MD  ranitidine (ZANTAC) 75 MG tablet Take 75 mg by mouth 2 (two) times daily.   Yes Historical Provider, MD  sertraline (ZOLOFT) 50 MG tablet TAKE 1 TABLET EVERY DAY 09/07/13   Yes Corwin Levins, MD  clotrimazole-betamethasone (LOTRISONE) cream APPLY TOPICALLY TWICE DAILY as needed Patient not taking: Reported on 07/24/2015 03/17/14   Corwin Levins, MD  diphenhydrAMINE (BENADRYL) 25 MG tablet 1-2 tabs by mouth every 6 hrs as needed for itching or swelling Patient not taking: Reported on 07/24/2015 11/19/14   Corwin Levins, MD  HYDROcodone-homatropine [facility]) 5-1.5 MG/5ML syrup Take 5 mLs by mouth every 6 (six) hours as needed for cough. Patient not taking: Reported on 07/24/2015 12/08/14   Corwin Levins, MD  hydrOXYzine (ATARAX/VISTARIL) 25 MG tablet Take 1 tablet (25 mg total) by mouth 3 (three) times daily as needed. Patient not taking: Reported on 07/24/2015 11/19/14   Corwin Levins, MD  levalbuterol [facility]) 1.25 MG/3ML nebulizer solution Take 1 ampule by nebulization every 4 (four) hours as needed for shortness of breath.     Historical Provider, MD  levofloxacin (LEVAQUIN) 250 MG tablet Take 1  tablet (250 mg total) by mouth daily. Patient not taking: Reported on 07/24/2015 12/08/14   Corwin Levins, MD  ondansetron [facility]) 4 MG tablet Take 1/2-1 tab every 8 hours prn nausea Patient taking differently: Take 4 mg by mouth every 8 (eight) hours as needed for nausea or vomiting.  03/17/14   Corwin Levins, MD  polyethylene glycol [facility]) powder Take 17 g by mouth daily as needed for mild constipation. 17 gm in water by mouth once daily as needed    Historical Provider, MD  predniSONE (DELTASONE) 10 MG tablet 3 tabs by mouth per day for 3 days,2tabs per day for 3 days,1tab per day for 3 days Patient not taking: Reported on 07/24/2015 12/08/14   Corwin Levins, MD  traMADol (ULTRAM) 50 MG tablet Take 1 tablet (50 mg total) by mouth every 6 (six) hours as needed. for pain 07/12/15   Corwin Levins, MD  triamcinolone cream (KENALOG) 0.1 % APPLY TO AFFECTED AREA TWICE A DAY Patient not taking: Reported on 07/24/2015 03/17/14   Corwin Levins, MD   Physical Exam: Filed Vitals:   07/24/15 1300  07/24/15 1350 07/24/15 1400 07/24/15 1428  BP: 177/73 153/63 166/89 172/75  Pulse: 70 76 74 74  Temp:   98.2 F (36.8 C) 98.5 F (36.9 C)  TempSrc:    Oral  Resp: 18 18 18 18   Height:    5\' 5"  (1.651 m)  Weight:    65.3 kg (143 lb 15.4 oz)  SpO2: 100% 95% 97% 98%    Wt Readings from Last 3 Encounters:  07/24/15 65.3 kg (143 lb 15.4 oz)  09/17/13 57.153 kg (126 lb)  07/31/13 57.153 kg (126 lb)    General:  Appears calm and comfortable; pleasant alert, very hard of hearing 81 year old Caucasian woman in no acute distress. Eyes: PERRL, normal lids, irises & conjunctiva; conjunctivae are clear and sclerae are white. ENT: grossly normal hearing, lips & tongue; partial dentures; no oropharyngeal exudates or erythema. Neck: no LAD, masses or thyromegaly Cardiovascular: S1, S2, no murmurs rubs or gallops. 1+ right lower extremity edema, nonpitting and trace of left lower extremity edema. Telemetry: Not applicable  Respiratory: CTA bilaterally; decreased breath sounds in the bases Normal respiratory effort. Abdomen: soft, ntnd; positive bowel sounds, no masses palpated, no hepatosplenomegaly. Skin: Right lower extremity with diffuse erythema from below to the knee to the ankle to the foot; tense bulla-approximately 2 cm to 3 cm in diameter noted on the medial aspect of the ankle with a scant amount of yellow drainage, non-malodorous; moderately tender to palpation; pedal pulses palpable; she is able to flex and extend her foot, but with some discomfort. Well-healed scars on both knees. Both arms with a few skin tears, nonbleeding. Musculoskeletal: Mild arthritic changes noted of her hands and her feet with hammertoe left foot. Psychiatric: grossly normal mood and affect, speech fluent and appropriate; pleasant affect. Neurologic: She is hard of hearing, otherwise cranial nerves II through XII are intact.           Labs on Admission:  Basic Metabolic Panel:  Recent Labs Lab 07/24/15 1211   NA 138  K 4.1  CL 101  CO2 32  GLUCOSE 97  BUN 12  CREATININE 0.78  CALCIUM 9.0   Liver Function Tests:  Recent Labs Lab 07/24/15 1211  AST 17  ALT 9*  ALKPHOS 41  BILITOT 0.5  PROT 6.7  ALBUMIN 3.0*   No results for input(s): LIPASE, AMYLASE in  the last 168 hours. No results for input(s): AMMONIA in the last 168 hours. CBC:  Recent Labs Lab 07/24/15 1211  WBC 10.0  NEUTROABS 7.1  HGB 11.6*  HCT 36.2  MCV 94.0  PLT 344   Cardiac Enzymes: No results for input(s): CKTOTAL, CKMB, CKMBINDEX, TROPONINI in the last 168 hours.  BNP (last 3 results) No results for input(s): BNP in the last 8760 hours.  ProBNP (last 3 results) No results for input(s): PROBNP in the last 8760 hours.  CBG: No results for input(s): GLUCAP in the last 168 hours.  Radiological Exams on Admission: Dg Ankle Complete Right  07/24/2015   CLINICAL DATA:  Right ankle pain and swelling with a open wound near the medial malleolus.  EXAM: RIGHT ANKLE - COMPLETE 3+ VIEW  COMPARISON:  None.  FINDINGS: There is diffuse soft tissue swelling/edema involving the entire ankle and visualized foot. There are extensive vascular calcifications. No gas is seen in the soft tissues. No destructive bony changes.  Findings suspicious for a nondisplaced medial malleolus fracture. There is also a small avulsion fracture involving the distal tip of the lateral malleolus.  IMPRESSION: Suspect nondisplaced, possibly healing, medial malleolus fracture.  Possible small avulsion fracture involving the distal tip of the lateral malleolus.  Diffuse subcutaneous soft tissue swelling/ edema without gas in the soft tissues.   Electronically Signed   By: Rudie Meyer M.D.   On: 07/24/2015 14:07   Dg Foot Complete Right  07/24/2015   CLINICAL DATA:  Right foot and ankle swelling with cellulitis right ankle medially  EXAM: RIGHT FOOT COMPLETE - 3+ VIEW  COMPARISON:  None.  FINDINGS: Severe diffuse osteopenia. Diffuse soft tissue  swelling over the foot and ankle. Diffuse small vessel calcification. No fracture or dislocation. No periosteal reaction.  IMPRESSION: Soft tissue changes with no acute osseous abnormalities   Electronically Signed   By: Esperanza Heir M.D.   On: 07/24/2015 14:05    EKG: Independently reviewed. Not applicable  Assessment/Plan Principal Problem:   Cellulitis of right ankle Active Problems:   Fracture of malleolus, right ankle, closed   Chronic depression   Essential hypertension   Allergic rhinitis   Asthma   GERD   1. Cellulitis of the right ankle/foot and fracture of the right malleolus. Apparently, the patient has had multiple scrapes on her legs from the motorized wheelchair. Most recently she scraped her right ankle likely causing a laceration which led to progressive cellulitis. She is afebrile and her white blood cell count is within normal limits. X-ray of her ankle/foot reveal a suspicion of a nondisplaced, possibly healing medial malleolus fracture and a possible small avulsion fracture involving the distal tip of the lateral malleolus. Will discuss the findings with orthopedic surgeon, but I doubt that the findings would necessitate surgical input. Vancomycin was given in the ED. This will be continued. We will order application of warm compresses; keep leg elevated. Consider general surgery consultation for I&D if warm compresses were not successful.  2.  Essential hypertension. The patient is no longer treated with antihypertensives medications. We'll continue to follow her blood pressure for pharmacological intervention. 3. Chronic GERD. The patient is treated both with Zantac and Nexium. These will be continued. 4. Depression. Currently stable. We'll continue Zoloft. 5.  Seasonal allergies/allergic rhinitis. Currently stable. We'll continue anti-histamine and nasal spray.    Code Status: DO NOT RESUSCITATE as discussed with patient and daughter-in-law.  DVT  Prophylaxis:subcutaneous heparin  Family Communication: discuss with Robin Arroyo, daughter-in-law  Disposition Plan: anticipate discharge in the next 2-3 days.   Time spent: one hour   [facility] Pager [phone]

## 2015-07-24 NOTE — Progress Notes (Signed)
ANTIBIOTIC CONSULT NOTE - INITIAL  Pharmacy Consult for vancomycin Indication: cellulitis, wound infection  Allergies  Allergen Reactions  . Ace Inhibitors Other (See Comments)    rash  . Alendronate Sodium     REACTION: constipation  . Citalopram Hydrobromide Other (See Comments)    Unknown reaction  . Hydrocodone Nausea Only  . Ibandronate Sodium     REACTION: gi upset  . Mirtazapine Other (See Comments)    Unknown reaction     Patient Measurements: Height: 5\' 5"  (165.1 cm) Weight: 130 lb (58.968 kg) IBW/kg (Calculated) : 57 Body Weight: 59 kg  Vital Signs: Temp: 98.3 F (36.8 C) (09/18 1141) Temp Source: Oral (09/18 1141) BP: 158/72 mmHg (09/18 1141) Pulse Rate: 73 (09/18 1141) Intake/Output from previous day:   Intake/Output from this shift:    Labs:  Recent Labs  07/24/15 1211  WBC 10.0  HGB 11.6*  PLT 344   CrCl cannot be calculated (Patient has no serum creatinine result on file.). No results for input(s): VANCOTROUGH, VANCOPEAK, VANCORANDOM, GENTTROUGH, GENTPEAK, GENTRANDOM, TOBRATROUGH, TOBRAPEAK, TOBRARND, AMIKACINPEAK, AMIKACINTROU, AMIKACIN in the last 72 hours.   Microbiology: No results found for this or any previous visit (from the past 720 hour(s)).  Medical History: Past Medical History  Diagnosis Date  . Unspecified vitamin D deficiency 07/20/2009  . HYPERLIPIDEMIA 12/11/2007  . ANXIETY 12/11/2007  . INSOMNIA-SLEEP DISORDER-UNSPEC 12/11/2007  . DEPRESSION 12/11/2007  . HYPERTENSION 12/11/2007  . ALLERGIC RHINITIS 12/11/2007  . ASTHMA 12/11/2007  . GERD 12/11/2007  . HIATAL HERNIA 08/17/2010  . DIVERTICULOSIS, COLON 12/11/2007  . ARTHRITIS, RHEUMATOID 12/11/2007  . OSTEOARTHRITIS, KNEES, BILATERAL 01/18/2010  . OSTEOPOROSIS 12/11/2007  . INSOMNIA-SLEEP DISORDER-UNSPEC 06/08/2008  . FATIGUE 12/11/2007  . SKIN RASH 05/11/2008  . CONTUSION, LOWER LEG, RIGHT 01/19/2009  . COLONIC POLYPS, HYPERPLASTIC, HX OF 08/17/2010  . DERMATITIS FACTITIA 01/10/2011  .  PRURITUS 01/10/2011  . CHEST PAIN 01/15/2011  . Single kidney     functional   . Lumbar spine pain     DJD  . Hearing loss     hearing aids  . Vitamin D deficiency   . Acromial fracture 04/17/2011    Medications:  See medication history Assessment: 81 yo lady to start vancomycin for cellulitis.    Goal of Therapy:  Vancomycin trough level 10-15 mcg/ml  Plan:  Vancomycin 1 gm IV X 1 then 500 mg IV q24 hours F/u renal function, cultures and clinical course  Thanks for allowing pharmacy to be a part of this patient's care.  97, PharmD Clinical Pharmacist  07/24/2015,12:27 PM

## 2015-07-24 NOTE — ED Notes (Signed)
Patient with cellulitis and wound to right medial ankle.

## 2015-07-24 NOTE — ED Notes (Signed)
Report given to Misty Stanley, RN for room 335.

## 2015-07-25 DIAGNOSIS — S82891D Other fracture of right lower leg, subsequent encounter for closed fracture with routine healing: Secondary | ICD-10-CM

## 2015-07-25 LAB — BASIC METABOLIC PANEL
Anion gap: 4 — ABNORMAL LOW (ref 5–15)
BUN: 10 mg/dL (ref 6–20)
CALCIUM: 8.7 mg/dL — AB (ref 8.9–10.3)
CO2: 31 mmol/L (ref 22–32)
CREATININE: 0.7 mg/dL (ref 0.44–1.00)
Chloride: 104 mmol/L (ref 101–111)
GFR calc Af Amer: >60 mL/min (ref >60)
GLUCOSE: 97 mg/dL (ref 65–99)
POTASSIUM: 4.4 mmol/L (ref 3.5–5.1)
SODIUM: 139 mmol/L (ref 135–145)

## 2015-07-25 LAB — CBC
HEMATOCRIT: 34.8 % — AB (ref 36.0–46.0)
Hemoglobin: 11.1 g/dL — ABNORMAL LOW (ref 12.0–15.0)
MCH: 29.8 pg (ref 26.0–34.0)
MCHC: 31.9 g/dL (ref 30.0–36.0)
MCV: 93.5 fL (ref 78.0–100.0)
PLATELETS: 345 10*3/uL (ref 150–400)
RBC: 3.72 MIL/uL — ABNORMAL LOW (ref 3.87–5.11)
RDW: 13 % (ref 11.5–15.5)
WBC: 10.1 10*3/uL (ref 4.0–10.5)

## 2015-07-25 LAB — VITAMIN B12: Vitamin B-12: 319 pg/mL (ref 180–914)

## 2015-07-25 LAB — TSH: TSH: 1.396 u[IU]/mL (ref 0.350–4.500)

## 2015-07-25 LAB — MRSA PCR SCREENING: MRSA BY PCR: NEGATIVE

## 2015-07-25 LAB — IRON AND TIBC
IRON: 20 ug/dL — AB (ref 28–170)
Saturation Ratios: 8 % — ABNORMAL LOW (ref 10.4–31.8)
TIBC: 265 ug/dL (ref 250–450)
UIBC: 245 ug/dL

## 2015-07-25 MED ORDER — SODIUM CHLORIDE 0.9 % IJ SOLN
10.0000 mL | Freq: Two times a day (BID) | INTRAMUSCULAR | Status: DC
  Administered 2015-07-25 – 2015-07-28 (×6): 10 mL via INTRAVENOUS

## 2015-07-25 NOTE — Progress Notes (Signed)
[facility] PROGRESS NOTE  Robin Arroyo OFB:[id number] DOB: 02-11-1919 DOA: 07/24/2015 PCP: No primary care provider on file.  Assessment/Plan: 1. Cellulitis of the right ankle/foot and fracture of the right malleolus. Related to  multiple scrapes on her legs from the motorized wheelchair. Most recently she scraped her right ankle likely causing a laceration which led to progressive cellulitis. She remains afebrile and her white blood cell count is within normal limits. X-ray of her ankle/foot reveal a suspicion of a nondisplaced, possibly healing medial malleolus fracture and a possible small avulsion fracture involving the distal tip of the lateral malleolus. Have requested orthopedic surgeon consult. Soft Bulla with no drainage. Continue warm compress. Consider surgical consult if no improvement.  2. Essential hypertension. Not on antihypertensive medications at home. Improved control today. Will monitor closely 3. Chronic GERD. Stable at baseline. Continue  Zantac and Nexium.  4. Depression. remains stable. We'll continue Zoloft. 5. Seasonal allergies/allergic rhinitis. remains stable. We'll continue anti-histamine and nasal spray.  Code Status: DNR Family Communication:  Disposition Plan: back to facility   Consultants:  Orthopedic surgery  Procedures:  none  Antibiotics: Vancomycin 07/24/15>> HPI/Subjective: Lying in bed eyes closed. Easily aroused  Objective: Filed Vitals:   07/25/15 0433  BP: 132/76  Pulse: 68  Temp: 98.6 F (37 C)  Resp: 18    Intake/Output Summary (Last 24 hours) at 07/25/15 1416 Last data filed at 07/25/15 0849  Gross per 24 hour  Intake 1064.83 ml  Output      0 ml  Net 1064.83 ml   Filed Weights   07/24/15 1141 07/24/15 1428  Weight: 58.968 kg (130 lb) 65.3 kg (143 lb 15.4 oz)    Exam:   General:  Obese calm appears comfortable  Cardiovascular: RRR no m/g/r left LE with trace LE edema right LE with erythema mid shin  extending to ankle and foot. Soft Bulla medial aspect of ankle. Mild heat and tenderness.   Respiratory: normal effort BS diminished but clear bilaterally no wheeze   Abdomen: obese soft +BS non-tender to palpation  Musculoskeletal: hammertoe left foot, joints without swelling   Data Reviewed: Basic Metabolic Panel:  Recent Labs Lab 07/24/15 1211 07/25/15 0810  NA 138 139  K 4.1 4.4  CL 101 104  CO2 32 31  GLUCOSE 97 97  BUN 12 10  CREATININE 0.78 0.70  CALCIUM 9.0 8.7*   Liver Function Tests:  Recent Labs Lab 07/24/15 1211  AST 17  ALT 9*  ALKPHOS 41  BILITOT 0.5  PROT 6.7  ALBUMIN 3.0*   No results for input(s): LIPASE, AMYLASE in the last 168 hours. No results for input(s): AMMONIA in the last 168 hours. CBC:  Recent Labs Lab 07/24/15 1211 07/25/15 0810  WBC 10.0 10.1  NEUTROABS 7.1  --   HGB 11.6* 11.1*  HCT 36.2 34.8*  MCV 94.0 93.5  PLT 344 345   Cardiac Enzymes: No results for input(s): CKTOTAL, CKMB, CKMBINDEX, TROPONINI in the last 168 hours. BNP (last 3 results) No results for input(s): BNP in the last 8760 hours.  ProBNP (last 3 results) No results for input(s): PROBNP in the last 8760 hours.  CBG: No results for input(s): GLUCAP in the last 168 hours.  Recent Results (from the past 240 hour(s))  Culture, routine-abscess     Status: None (Preliminary result)   Collection Time: 07/24/15 11:45 AM  Result Value Ref Range Status   Specimen Description ABSCESS RIGHT ANKLE ANTERIOR ASPECT  Final   Special  Requests NONE  Final   Gram Stain   Final    NO WBC SEEN FEW SQUAMOUS EPITHELIAL CELLS PRESENT MODERATE GRAM POSITIVE COCCI IN PAIRS IN CLUSTERS Performed at [facility]    Culture   Final    ABUNDANT STAPHYLOCOCCUS AUREUS Note: RIFAMPIN AND GENTAMICIN SHOULD NOT BE USED AS SINGLE DRUGS FOR TREATMENT OF STAPH INFECTIONS. Performed at [facility]    Report Status PENDING  Incomplete  Culture, routine-abscess      Status: None (Preliminary result)   Collection Time: 07/24/15 11:48 AM  Result Value Ref Range Status   Specimen Description ABSCESS RIGHT ANKLE  Final   Special Requests NONE  Final   Gram Stain   Final    NO WBC SEEN RARE SQUAMOUS EPITHELIAL CELLS PRESENT FEW GRAM POSITIVE COCCI IN PAIRS Performed at [facility]    Culture   Final    MODERATE STAPHYLOCOCCUS AUREUS Note: RIFAMPIN AND GENTAMICIN SHOULD NOT BE USED AS SINGLE DRUGS FOR TREATMENT OF STAPH INFECTIONS. Performed at [facility]    Report Status PENDING  Incomplete  MRSA PCR Screening     Status: None   Collection Time: 07/24/15 10:45 PM  Result Value Ref Range Status   MRSA by PCR NEGATIVE NEGATIVE Final    Comment:        The GeneXpert MRSA Assay (FDA approved for NASAL specimens only), is one component of a comprehensive MRSA colonization surveillance program. It is not intended to diagnose MRSA infection nor to guide or monitor treatment for MRSA infections.      Studies: Dg Ankle Complete Right  07/24/2015   CLINICAL DATA:  Right ankle pain and swelling with a open wound near the medial malleolus.  EXAM: RIGHT ANKLE - COMPLETE 3+ VIEW  COMPARISON:  None.  FINDINGS: There is diffuse soft tissue swelling/edema involving the entire ankle and visualized foot. There are extensive vascular calcifications. No gas is seen in the soft tissues. No destructive bony changes.  Findings suspicious for a nondisplaced medial malleolus fracture. There is also a small avulsion fracture involving the distal tip of the lateral malleolus.  IMPRESSION: Suspect nondisplaced, possibly healing, medial malleolus fracture.  Possible small avulsion fracture involving the distal tip of the lateral malleolus.  Diffuse subcutaneous soft tissue swelling/ edema without gas in the soft tissues.   Electronically Signed   By: Rudie Meyer M.D.   On: 07/24/2015 14:07   Dg Foot Complete Right  07/24/2015   CLINICAL DATA:  Right  foot and ankle swelling with cellulitis right ankle medially  EXAM: RIGHT FOOT COMPLETE - 3+ VIEW  COMPARISON:  None.  FINDINGS: Severe diffuse osteopenia. Diffuse soft tissue swelling over the foot and ankle. Diffuse small vessel calcification. No fracture or dislocation. No periosteal reaction.  IMPRESSION: Soft tissue changes with no acute osseous abnormalities   Electronically Signed   By: Esperanza Heir M.D.   On: 07/24/2015 14:05    Scheduled Meds: . azelastine  2 spray Each Nare BID  . budesonide  0.25 mg Nebulization BID  . cholecalciferol  1,000 Units Oral Daily  . docusate sodium  100 mg Oral BID  . famotidine  20 mg Oral BID  . heparin  5,000 Units Subcutaneous 3 times per day  . loratadine  10 mg Oral Daily  . pantoprazole  80 mg Oral Q1200  . sertraline  50 mg Oral Daily  . vancomycin  500 mg Intravenous Q24H   Continuous Infusions: . 0.9 %  NaCl with KCl 20 mEq / L 70 mL/hr at 07/24/15 1625    Principal Problem:   Cellulitis of right ankle Active Problems:   Chronic depression   Essential hypertension   Allergic rhinitis   Asthma   GERD   Fracture of malleolus, right ankle, closed    Time spent: 30 minutes    [facility] M  [facility] Pager [phone]. If 7PM-7AM, please contact night-coverage at [email or web address], password [email or web address] 07/25/2015, 2:16 PM  LOS: 1 day

## 2015-07-25 NOTE — Progress Notes (Signed)
Late Entry this am: Text paged Dr. Sherrie Mustache to inquire if she wanted to order this patient an orthopedic consult.

## 2015-07-25 NOTE — Clinical Social Work Note (Signed)
Clinical Social Work Assessment  Patient Details  Name: Robin Arroyo MRN: [id number] Date of Birth: 1919/04/08  Date of referral:  07/25/15               Reason for consult:  Facility Placement                Permission sought to share information with:  Family Supports Permission granted to share information::  Yes, Verbal Permission Granted  Name::     Robin Arroyo  Agency::     Relationship::  daughter  Contact Information:     Housing/Transportation Living arrangements for the past 2 months:  [place] of Information:  Patient Patient Interpreter Needed:  None Criminal Activity/Legal Involvement Pertinent to Current Situation/Hospitalization:  No - Comment as needed Significant Relationships:  Adult Children Lives with:  Facility Resident Do you feel safe going back to the place where you live?  Yes Need for family participation in patient care:  Yes (Comment)  Care giving concerns:  Pt is long term resident at [facility].    Social Worker assessment / plan:  CSW met with pt at bedside. Pt alert and oriented, but very hard of hearing. She has been a resident at [facility] for about a year and a half. Pt has several children who are involved and supportive and visit regularly. CSW left voicemail for pt's daughter, Robin Arroyo who pt states handles everything for her. Pt shared that she was living at home with her son who had an aneurysm in his 96s. It got to the point where they could no longer manage at home together and both were placed in ALF. She admits that this was difficult, but states, "We just had to accept all of it." Brief support provided. Pt plans to return to [facility] at d/c. Per Lattie Haw at facility, pt uses motorized wheelchair. She requires some assist getting in and out and will occasionally need assistance with bathing, toileting, and dressing. Okay for return. Pt has had home health in the past, but recently stopped.   Employment status:   Retired Forensic scientist:  Medicare PT Recommendations:  Not assessed at this time [place] / Referral to community resources:  Other (Comment Required) (return to [facility])  Patient/Family's Response to care:  Pt requests return to [facility].  Patient/Family's Understanding of and Emotional Response to Diagnosis, Current Treatment, and Prognosis:  Pt shared some information regarding her hospital admission. Will discuss further with daughter when she returns call. Pt states she considers [facility] "home" now and she plans to return there at d/c.   Emotional Assessment Appearance:  Appears stated age Attitude/Demeanor/Rapport:  Other (Cooperative) Affect (typically observed):  Appropriate Orientation:  Oriented to Self, Oriented to Place, Oriented to Situation Alcohol / Substance use:  Not Applicable Psych involvement (Current and /or in the community):  No (Comment)  Discharge Needs  Concerns to be addressed:  Discharge Planning Concerns Readmission within the last 30 days:  No Current discharge risk:  None Barriers to Discharge:  Continued Medical Work up   [employer], [employer], [place] 07/25/2015, 10:41 AM  [phone]

## 2015-07-25 NOTE — Progress Notes (Signed)
Patients right ankle was marked as ordered.  Heat was applied and the extremity was elevated. Will continue to monitor.

## 2015-07-25 NOTE — Consult Note (Signed)
Reason for Consult:Fractures of right ankle, cellulitis Referring Physician: hospitalist  Robin Arroyo is an 81 y.o. female.  HPI: She is wheelchair bound.  She bumped against something a few days ago and got an abrasion and laceration over the medial right ankle.  The ankle and surrounding area has gotten very red, the wound is draining and it hurts.  She has no other injury.  She has no fever or chills.  Past Medical History  Diagnosis Date  . Unspecified vitamin D deficiency 07/20/2009  . HYPERLIPIDEMIA 12/11/2007  . ANXIETY 12/11/2007  . INSOMNIA-SLEEP DISORDER-UNSPEC 12/11/2007  . DEPRESSION 12/11/2007  . HYPERTENSION 12/11/2007  . ALLERGIC RHINITIS 12/11/2007  . ASTHMA 12/11/2007  . GERD 12/11/2007  . HIATAL HERNIA 08/17/2010  . DIVERTICULOSIS, COLON 12/11/2007  . ARTHRITIS, RHEUMATOID 12/11/2007  . OSTEOARTHRITIS, KNEES, BILATERAL 01/18/2010  . OSTEOPOROSIS 12/11/2007  . INSOMNIA-SLEEP DISORDER-UNSPEC 06/08/2008  . FATIGUE 12/11/2007  . SKIN RASH 05/11/2008  . CONTUSION, LOWER LEG, RIGHT 01/19/2009  . COLONIC POLYPS, HYPERPLASTIC, HX OF 08/17/2010  . DERMATITIS FACTITIA 01/10/2011  . PRURITUS 01/10/2011  . CHEST PAIN 01/15/2011  . Single kidney     functional   . Lumbar spine pain     DJD  . Hearing loss     hearing aids  . Vitamin D deficiency   . Acromial fracture 04/17/2011    Past Surgical History  Procedure Laterality Date  . Abdominal hysterectomy    . Cholecystectomy    . S/p bilateral knee replacements    . S/p bladder surgery    . Appendectomy    . Tonsillectomy      Family History  Problem Relation Age of Onset  . Breast cancer Mother   . Prostate cancer Father   . Breast cancer Sister   . Diabetes Sister   . Prostate cancer Brother   . Cancer Father     bladder  . Prostate cancer Brother   . Colon cancer Neg Hx   . Stomach cancer Sister     Social History:  reports that she has quit smoking. She does not have any smokeless tobacco history on file. She reports that  she does not drink alcohol or use illicit drugs.  Allergies:  Allergies  Allergen Reactions  . Ace Inhibitors Other (See Comments)    rash  . Alendronate Sodium     REACTION: constipation  . Citalopram Hydrobromide Other (See Comments)    Unknown reaction  . Hydrocodone Nausea Only  . Ibandronate Sodium     REACTION: gi upset  . Mirtazapine Other (See Comments)    Unknown reaction     Medications: I have reviewed the patient's current medications.  Results for orders placed or performed during the hospital encounter of 07/24/15 (from the past 48 hour(s))  Culture, routine-abscess     Status: None (Preliminary result)   Collection Time: 07/24/15 11:45 AM  Result Value Ref Range   Specimen Description ABSCESS RIGHT ANKLE ANTERIOR ASPECT    Special Requests NONE    Gram Stain      NO WBC SEEN FEW SQUAMOUS EPITHELIAL CELLS PRESENT MODERATE GRAM POSITIVE COCCI IN PAIRS IN CLUSTERS Performed at [facility]    Culture      ABUNDANT STAPHYLOCOCCUS AUREUS Note: RIFAMPIN AND GENTAMICIN SHOULD NOT BE USED AS SINGLE DRUGS FOR TREATMENT OF STAPH INFECTIONS. Performed at [facility]    Report Status PENDING   Culture, routine-abscess     Status: None (Preliminary result)  Collection Time: 07/24/15 11:48 AM  Result Value Ref Range   Specimen Description ABSCESS RIGHT ANKLE    Special Requests NONE    Gram Stain      NO WBC SEEN RARE SQUAMOUS EPITHELIAL CELLS PRESENT FEW GRAM POSITIVE COCCI IN PAIRS Performed at [facility]    Culture      MODERATE STAPHYLOCOCCUS AUREUS Note: RIFAMPIN AND GENTAMICIN SHOULD NOT BE USED AS SINGLE DRUGS FOR TREATMENT OF STAPH INFECTIONS. Performed at [facility]    Report Status PENDING   CBC with Differential     Status: Abnormal   Collection Time: 07/24/15 12:11 PM  Result Value Ref Range   WBC 10.0 4.0 - 10.5 K/uL   RBC 3.85 (L) 3.87 - 5.11 MIL/uL   Hemoglobin 11.6 (L) 12.0 - 15.0 g/dL   HCT 36.2  36.0 - 46.0 %   MCV 94.0 78.0 - 100.0 fL   MCH 30.1 26.0 - 34.0 pg   MCHC 32.0 30.0 - 36.0 g/dL   RDW 13.0 11.5 - 15.5 %   Platelets 344 150 - 400 K/uL   Neutrophils Relative % 71 %   Neutro Abs 7.1 1.7 - 7.7 K/uL   Lymphocytes Relative 17 %   Lymphs Abs 1.7 0.7 - 4.0 K/uL   Monocytes Relative 10 %   Monocytes Absolute 1.0 0.1 - 1.0 K/uL   Eosinophils Relative 2 %   Eosinophils Absolute 0.2 0.0 - 0.7 K/uL   Basophils Relative 0 %   Basophils Absolute 0.0 0.0 - 0.1 K/uL  Comprehensive metabolic panel     Status: Abnormal   Collection Time: 07/24/15 12:11 PM  Result Value Ref Range   Sodium 138 135 - 145 mmol/L   Potassium 4.1 3.5 - 5.1 mmol/L   Chloride 101 101 - 111 mmol/L   CO2 32 22 - 32 mmol/L   Glucose, Bld 97 65 - 99 mg/dL   BUN 12 6 - 20 mg/dL   Creatinine, Ser 0.78 0.44 - 1.00 mg/dL   Calcium 9.0 8.9 - 10.3 mg/dL   Total Protein 6.7 6.5 - 8.1 g/dL   Albumin 3.0 (L) 3.5 - 5.0 g/dL   AST 17 15 - 41 U/L   ALT 9 (L) 14 - 54 U/L   Alkaline Phosphatase 41 38 - 126 U/L   Total Bilirubin 0.5 0.3 - 1.2 mg/dL   GFR calc non Af Amer >60 >60 mL/min   GFR calc Af Amer >60 >60 mL/min    Comment: (NOTE) The eGFR has been calculated using the CKD EPI equation. This calculation has not been validated in all clinical situations. eGFR's persistently <60 mL/min signify possible Chronic Kidney Disease.    Anion gap 5 5 - 15  MRSA PCR Screening     Status: None   Collection Time: 07/24/15 10:45 PM  Result Value Ref Range   MRSA by PCR NEGATIVE NEGATIVE    Comment:        The GeneXpert MRSA Assay (FDA approved for NASAL specimens only), is one component of a comprehensive MRSA colonization surveillance program. It is not intended to diagnose MRSA infection nor to guide or monitor treatment for MRSA infections.   Basic metabolic panel     Status: Abnormal   Collection Time: 07/25/15  8:10 AM  Result Value Ref Range   Sodium 139 135 - 145 mmol/L   Potassium 4.4 3.5 - 5.1  mmol/L   Chloride 104 101 - 111 mmol/L   CO2 31 22 -  32 mmol/L   Glucose, Bld 97 65 - 99 mg/dL   BUN 10 6 - 20 mg/dL   Creatinine, Ser 0.70 0.44 - 1.00 mg/dL   Calcium 8.7 (L) 8.9 - 10.3 mg/dL   GFR calc non Af Amer >60 >60 mL/min   GFR calc Af Amer >60 >60 mL/min    Comment: (NOTE) The eGFR has been calculated using the CKD EPI equation. This calculation has not been validated in all clinical situations. eGFR's persistently <60 mL/min signify possible Chronic Kidney Disease.    Anion gap 4 (L) 5 - 15  CBC     Status: Abnormal   Collection Time: 07/25/15  8:10 AM  Result Value Ref Range   WBC 10.1 4.0 - 10.5 K/uL   RBC 3.72 (L) 3.87 - 5.11 MIL/uL   Hemoglobin 11.1 (L) 12.0 - 15.0 g/dL   HCT 34.8 (L) 36.0 - 46.0 %   MCV 93.5 78.0 - 100.0 fL   MCH 29.8 26.0 - 34.0 pg   MCHC 31.9 30.0 - 36.0 g/dL   RDW 13.0 11.5 - 15.5 %   Platelets 345 150 - 400 K/uL  Vitamin B12     Status: None   Collection Time: 07/25/15  8:10 AM  Result Value Ref Range   Vitamin B-12 319 180 - 914 pg/mL    Comment: (NOTE) This assay is not validated for testing neonatal or myeloproliferative syndrome specimens for Vitamin B12 levels. Performed at [facility]   Iron and TIBC     Status: Abnormal   Collection Time: 07/25/15  8:10 AM  Result Value Ref Range   Iron 20 (L) 28 - 170 ug/dL   TIBC 265 250 - 450 ug/dL   Saturation Ratios 8 (L) 10.4 - 31.8 %   UIBC 245 ug/dL    Comment: Performed at [facility]  TSH     Status: None   Collection Time: 07/25/15  8:15 AM  Result Value Ref Range   TSH 1.396 0.350 - 4.500 uIU/mL    Dg Ankle Complete Right  07/24/2015   CLINICAL DATA:  Right ankle pain and swelling with a open wound near the medial malleolus.  EXAM: RIGHT ANKLE - COMPLETE 3+ VIEW  COMPARISON:  None.  FINDINGS: There is diffuse soft tissue swelling/edema involving the entire ankle and visualized foot. There are extensive vascular calcifications. No gas is seen in the soft  tissues. No destructive bony changes.  Findings suspicious for a nondisplaced medial malleolus fracture. There is also a small avulsion fracture involving the distal tip of the lateral malleolus.  IMPRESSION: Suspect nondisplaced, possibly healing, medial malleolus fracture.  Possible small avulsion fracture involving the distal tip of the lateral malleolus.  Diffuse subcutaneous soft tissue swelling/ edema without gas in the soft tissues.   Electronically Signed   By: Marijo Sanes M.D.   On: 07/24/2015 14:07   Dg Foot Complete Right  07/24/2015   CLINICAL DATA:  Right foot and ankle swelling with cellulitis right ankle medially  EXAM: RIGHT FOOT COMPLETE - 3+ VIEW  COMPARISON:  None.  FINDINGS: Severe diffuse osteopenia. Diffuse soft tissue swelling over the foot and ankle. Diffuse small vessel calcification. No fracture or dislocation. No periosteal reaction.  IMPRESSION: Soft tissue changes with no acute osseous abnormalities   Electronically Signed   By: Skipper Cliche M.D.   On: 07/24/2015 14:05    Review of Systems  HENT:       Hard of hearing.  Cardiovascular:  History of hypertension, hyperlipidemia  Gastrointestinal: Positive for heartburn.  Musculoskeletal:       Bumped right ankle a few days ago, got abrasion/laceration.  Uses wheelchair, does not walk.  Endo/Heme/Allergies:       History of Vitamin D deficiency.   Psychiatric/Behavioral: Positive for depression.   Blood pressure 173/76, pulse 84, temperature 98.2 F (36.8 C), temperature source Oral, resp. rate 18, height 5' 5"  (1.651 m), weight 65.3 kg (143 lb 15.4 oz), SpO2 93 %. Physical Exam  Constitutional: She is oriented to person, place, and time. She appears well-developed and well-nourished.  HENT:  Head: Normocephalic and atraumatic.  Hard of hearing.  Eyes: Conjunctivae and EOM are normal. Pupils are equal, round, and reactive to light.  Neck: Normal range of motion. Neck supple.  Cardiovascular: Normal rate  and intact distal pulses.   Respiratory: Effort normal.  GI: Soft.  Musculoskeletal: She exhibits tenderness (Swelling and marked erythema of the right ankle medially with a 5 cm long superifical laceration present with oozing of slight bloody material.  Very tender.  Decreased ROM.  Laceration just at lower end of medial malleolus area. ).  Neurological: She is alert and oriented to person, place, and time. She has normal reflexes.  Skin: Skin is dry. There is erythema (Marked medial ankle on right with redness extending to part of dorsum of foot and about three inches from ankle joint medially with draining wound, very tender.).  Psychiatric: She has a normal mood and affect. Her behavior is normal. Judgment and thought content normal.    Assessment/Plan: Fracture of medial malleolus area, nondisplaced, possibly healing resolving fracture but it is on medial side of ankle with marked cellulitis and swelling and draining wound from the superficial laceration. Fracture of tip of lateral malleolus nondisplaced (thus a bimalleolar fracture) Marked erythema and cellulitis of the medial right ankle and foot Draining wound medial malleolus area  She will need to be on IV antibiotics for several days and may need a PICC line depending on how she responds to the medicine.  She needs to be followed closely.  The cellulitis could spread and it could affect her viability of the foot/ankle.  Needs elevation.  Possible PT consult for irrigation of wound.  I am concerned it is so close to the medial malleolus fracture she has.  Fortunately she has no diabetes.    Will follow.  Thanks.  KEELING,WAYNE 07/25/2015, 7:08 PM

## 2015-07-25 NOTE — Progress Notes (Signed)
Called in the room by the CNA because the patients blister had busted.  On assessment the patients right ankle the blister had busted and a copious amount of dark red blood with minimal amount of pus noted.  Dr. Sherrie Mustache was notified of the blister busting.  Will continue to monitor the patient.

## 2015-07-25 NOTE — Care Management Note (Signed)
Case Management Note  Patient Details  Name: Robin Arroyo MRN: [id number] Date of Birth: Nov 15, 1918  Subjective/Objective:                  Pt admitted from [facility]F with cellulitis. Anticipate pt will return to facility at discharge.  Action/Plan: CSW is aware and will arrange discharge to facility when medically stable.  Expected Discharge Date:                  Expected Discharge Plan:  Assisted Living / Rest Home  In-House Referral:  Clinical Social Work  Discharge planning Services  CM Consult  Post Acute Care Choice:  NA Choice offered to:  NA  DME Arranged:    DME Agency:     HH Arranged:    HH Agency:     Status of Service:  Completed, signed off  Medicare Important Message Given:    Date Medicare IM Given:    Medicare IM give by:    Date Additional Medicare IM Given:    Additional Medicare Important Message give by:     If discussed at Long Length of Stay Meetings, dates discussed:    Additional Comments:  Cheryl Flash, RN 07/25/2015, 12:36 PM

## 2015-07-26 DIAGNOSIS — K219 Gastro-esophageal reflux disease without esophagitis: Secondary | ICD-10-CM

## 2015-07-26 DIAGNOSIS — F329 Major depressive disorder, single episode, unspecified: Secondary | ICD-10-CM

## 2015-07-26 LAB — CBC
HEMATOCRIT: 35.6 % — AB (ref 36.0–46.0)
Hemoglobin: 11.4 g/dL — ABNORMAL LOW (ref 12.0–15.0)
MCH: 29.9 pg (ref 26.0–34.0)
MCHC: 32 g/dL (ref 30.0–36.0)
MCV: 93.4 fL (ref 78.0–100.0)
PLATELETS: 361 10*3/uL (ref 150–400)
RBC: 3.81 MIL/uL — ABNORMAL LOW (ref 3.87–5.11)
RDW: 13 % (ref 11.5–15.5)
WBC: 8.9 10*3/uL (ref 4.0–10.5)

## 2015-07-26 LAB — CULTURE, ROUTINE-ABSCESS
GRAM STAIN: NONE SEEN
Gram Stain: NONE SEEN

## 2015-07-26 MED ORDER — ENSURE ENLIVE PO LIQD
237.0000 mL | Freq: Two times a day (BID) | ORAL | Status: DC
  Administered 2015-07-27 – 2015-07-28 (×3): 237 mL via ORAL

## 2015-07-26 MED ORDER — CEFAZOLIN SODIUM 1-5 GM-% IV SOLN
1.0000 g | Freq: Three times a day (TID) | INTRAVENOUS | Status: DC
  Administered 2015-07-26 – 2015-07-27 (×5): 1 g via INTRAVENOUS
  Filled 2015-07-26 (×10): qty 50

## 2015-07-26 NOTE — Consult Note (Addendum)
WOC wound consult note Reason for Consult:Traumatic injury to right medial malleolus.  Noted fracture from wheelchair incident. Nonintact abrasion with cellulitis.  Bedside nurse assisting with e- Consult today.  Wound type:Traumatic injury Pressure Ulcer POA: N/A Measurement:4.2 cm x 4 cm x 0.3 cm with sloughing devitalized tissue from 9 to 2 o'clock on wound perimeter.  Wound AYT:[id number] tissue and pink nongranulating tissue.  Drainage (amount, consistency, odor) Minimal serosanguinous drainage.  No odor.  Periwound:Erythema, devitalized tissue, minimal purulence Dressing procedure/placement/frequency:Orthopedic MD has requested Hydrotherapy for irrigation and that has been ordered via PT.  Topical wound care to include cleansing wound with NS and pat gently dry. Apply calcium alginate [facility] [phone]) to wound bed to promote healing.  Cover with 4x4 gauze and wrap gauze.  Secure with tape.  Change daily.    Late entry:  Phone call from PT 07/26/15 PM that there was tunneling/undermining present that may have gone unnoticed during e consultation with bedside nurse.  After clinical assessment from PT, we decided to switch from calcium alginate to Iodoform packing strip for antimicrobial protection and to fill in the defect in the wound to promote healing. The orders are modified.   Will not follow at this time.  Please re-consult if needed.  Maple Hudson RN BSN CWON Pager [phone]

## 2015-07-26 NOTE — Progress Notes (Signed)
Subjective: I feel better   Objective: Vital signs in last 24 hours: Temp:  [98.1 F (36.7 C)-99.3 F (37.4 C)] 98.1 F (36.7 C) (09/20 0725) Pulse Rate:  [81-85] 81 (09/20 0725) Resp:  [16-18] 16 (09/20 0725) BP: (149-173)/(64-82) 149/82 mmHg (09/20 0725) SpO2:  [92 %-99 %] 97 % (09/20 0838)  Intake/Output from previous day: 09/19 0701 - 09/20 0700 In: 360 [P.O.:360] Out: -  Intake/Output this shift: Total I/O In: 120 [P.O.:120] Out: -    Recent Labs  07/24/15 1211 07/25/15 0810 07/26/15 0738  HGB 11.6* 11.1* 11.4*    Recent Labs  07/25/15 0810 07/26/15 0738  WBC 10.1 8.9  RBC 3.72* 3.81*  HCT 34.8* 35.6*  PLT 345 361    Recent Labs  07/24/15 1211 07/25/15 0810  NA 138 139  K 4.1 4.4  CL 101 104  CO2 32 31  BUN 12 10  CREATININE 0.78 0.70  GLUCOSE 97 97  CALCIUM 9.0 8.7*   No results for input(s): LABPT, INR in the last 72 hours.  Neurologically intact Neurovascular intact Sensation intact distally Intact pulses distally   The erythema of the right medial ankle is much less intense and smaller in size.  The antibiotics appear to be working.  She has less pain.  Sensation is intact.  Assessment/Plan: Cellulitis of the right medial ankle with bimalleolar fracture present.  Continue present wound care and antibiotics.  As wound improves, she will need a CAM walker to have when out of bed and in wheelchair.  It will not be needed when in bed.  That will protect fractures from displacing and also provide her some protection when she scrapes against objects.     KEELING,WAYNE 07/26/2015, 11:30 AM

## 2015-07-26 NOTE — Progress Notes (Signed)
Physical Therapy Wound Treatment Patient Details  Name: Robin Arroyo MRN: [id number] Date of Birth: [birth year]  Today's Date: 07/26/2015 Time: 1500-1605 Time Calculation (min): 65 min  Subjective  Subjective: pt hurt her ankle when it bumped the foot rest of her power w/c last Wednesday...it started to really hurt her on Thursday and by Friday there was a large blister formed Patient and Family Stated Goals: wants her ankle to heal Date of Onset: 07/20/15 Prior Treatments: none  Pain Score:    Wound Assessment        Wound Assessment and Plan  Wound Therapy - Assess/Plan/Recommendations Wound Therapy - Clinical Statement: pt injured the medial right ankle last week on her w/c footrest.  She is no longer ambulatory and mobilizes in her power w/c.  She is found to be alert and oriented, very cooperative and with no pain.  Her ankle also has a mild fx present which will be addressed by MD at a later date.  Currently, the medial ankle is very edemetous with erythema throughout the distal calf and ankle.  There are 2 small open wounds over this area which are draining blood.  With inspection using a Qtip, the wounds communicate and both have tunneling up to 3 cm  in the 12:00 to 2:00 positions.  There is no odor present and pt has good sensation.in the region.  The tissue is warm to the touch.  She received PL to both openings.  We had initially been ordered to use Calcium Alginate as a dressing by [facility] nurse but she was not aware that the wounds tunneled.  I called her and she then recommended Iodoform gauze for packing.  She stated that she would enter the order into the computer.  After PL, the wound was packed with Iodoform gauze, covered with 4x4s and wrapped with Kerlix.  She tolerated tx well.  RLE is elevated. Wound Therapy - Functional Problem List: pt has advanced age and is not ambulatory...one would assume that her circulation is compromised Factors Delaying/Impairing Wound Healing:  Immobility Hydrotherapy Plan: Debridement;Dressing change;Patient/family education;Pulsatile lavage with suction Wound Therapy - Frequency: 6X / week Wound Therapy - Current Recommendations: PT Wound Therapy - Follow Up Recommendations: Skilled nursing facility Wound Plan: PL to wound on medial right ankle followed by debridement as needed, pack with iodoform gauze, cover with 4x4s and kerlix.  Pt is to keep RLE elevated as much as possible.  she is getting OOB with nursing service to use [facility].  We need to encourage her to get up to recliner daily.  Wound Therapy Goals- Improve the function of patient's integumentary system by progressing the wound(s) through the phases of wound healing (inflammation - proliferation - remodeling) by: Decrease Length/Width/Depth by (cm): decrease tunneling of wounds to no more than 2 cm in depth (currently 3 cm) Decrease Length/Width/Depth - Progress: Goal set today Improve Drainage Characteristics: Other (comment) (decrease to scant serosanguinous drainage) Improve Drainage Characteristics - Progress: Goal set today Goals/treatment plan/discharge plan were made with and agreed upon by patient/family: Yes Time For Goal Achievement: 7 days Wound Therapy - Potential for Goals: Good  Goals will be updated until maximal potential achieved or discharge criteria met.  Discharge criteria: when goals achieved, discharge from hospital, MD decision/surgical intervention, no progress towards goals, refusal/missing three consecutive treatments without notification or medical reason.  GP     Demetrios Isaacs L  PT 07/26/2015, 4:36 PM [phone]

## 2015-07-26 NOTE — Progress Notes (Signed)
[facility] PROGRESS NOTE  Robin Arroyo FFM:[id number] DOB: May 27, 1919 DOA: 07/24/2015 PCP: No primary care [facility] on file.  Assessment/Plan: 1. Cellulitis of the right ankle/foot and fracture of the right malleolus. Related to multiple scrapes on her legs from the motorized wheelchair. Bulla on medial ankle open with moderate amount serosanguinous drainage. She remains afebrile and her white blood cell count is within normal limits. Evaluated by orthopedic surgery who opines concern about cellulitis spreading and its proximity to fracture. He opines may need irrigation. Will request wound consult. Continue Vancomycin day #3. 2. Essential hypertension. Not on antihypertensive medications at home. BP remains high end of normal. Chart review indicates allergy to ACE inhibitor. Consider low dose HCTZ.  Will monitor closely 3. Chronic GERD. Remains stable at baseline. Continue Zantac and Nexium.  4. Depression. remains stable. We'll continue Zoloft. 5. Seasonal allergies/allergic rhinitis. remains stable. We'll continue anti-histamine and nasal spray.  Code Status: no code blue Family Communication: none present Disposition Plan: back to facility when ready   Consultants:  Orthopedic surger  Procedures:  none  Antibiotics:  Vancomycin 07/24/15>>  HPI/Subjective: Patient reports less pain in ankle this am. She is alert and oriented  Objective: Filed Vitals:   07/26/15 0725  BP: 149/82  Pulse: 81  Temp: 98.1 F (36.7 C)  Resp: 16    Intake/Output Summary (Last 24 hours) at 07/26/15 0951 Last data filed at 07/26/15 0824  Gross per 24 hour  Intake    240 ml  Output      0 ml  Net    240 ml   Filed Weights   07/24/15 1141 07/24/15 1428  Weight: 58.968 kg (130 lb) 65.3 kg (143 lb 15.4 oz)    Exam:   General:  Well nourished appears comfortable  Cardiovascular: RRR no MGR Left LE with trace non-pitting edema. Right leg with 1+ edema  Respiratory: normal  effort BS distant no wheeze  Abdomen: obese soft +BS non-tender to palpation  Musculoskeletal: medial aspect right ankle with open draining wound, no odor, tissue pink, less swelling, remains tender to touch. Erythema extends to mid shin.   Data Reviewed: Basic Metabolic Panel:  Recent Labs Lab 07/24/15 1211 07/25/15 0810  NA 138 139  K 4.1 4.4  CL 101 104  CO2 32 31  GLUCOSE 97 97  BUN 12 10  CREATININE 0.78 0.70  CALCIUM 9.0 8.7*   Liver Function Tests:  Recent Labs Lab 07/24/15 1211  AST 17  ALT 9*  ALKPHOS 41  BILITOT 0.5  PROT 6.7  ALBUMIN 3.0*   No results for input(s): LIPASE, AMYLASE in the last 168 hours. No results for input(s): AMMONIA in the last 168 hours. CBC:  Recent Labs Lab 07/24/15 1211 07/25/15 0810 07/26/15 0738  WBC 10.0 10.1 8.9  NEUTROABS 7.1  --   --   HGB 11.6* 11.1* 11.4*  HCT 36.2 34.8* 35.6*  MCV 94.0 93.5 93.4  PLT 344 345 361   Cardiac Enzymes: No results for input(s): CKTOTAL, CKMB, CKMBINDEX, TROPONINI in the last 168 hours. BNP (last 3 results) No results for input(s): BNP in the last 8760 hours.  ProBNP (last 3 results) No results for input(s): PROBNP in the last 8760 hours.  CBG: No results for input(s): GLUCAP in the last 168 hours.  Recent Results (from the past 240 hour(s))  Culture, routine-abscess     Status: None   Collection Time: 07/24/15 11:45 AM  Result Value Ref Range Status   Specimen Description  ABSCESS RIGHT ANKLE ANTERIOR ASPECT  Final   Special Requests NONE  Final   Gram Stain   Final    NO WBC SEEN FEW SQUAMOUS EPITHELIAL CELLS PRESENT MODERATE GRAM POSITIVE COCCI IN PAIRS IN CLUSTERS Performed at [facility]    Culture   Final    ABUNDANT STAPHYLOCOCCUS AUREUS Note: RIFAMPIN AND GENTAMICIN SHOULD NOT BE USED AS SINGLE DRUGS FOR TREATMENT OF STAPH INFECTIONS. This organism DOES NOT demonstrate inducible Clindamycin resistance in vitro. Performed at [facility]     Report Status 07/26/2015 FINAL  Final   Organism ID, Bacteria STAPHYLOCOCCUS AUREUS  Final      Susceptibility   Staphylococcus aureus - MIC*    CLINDAMYCIN <=0.25 SENSITIVE Sensitive     ERYTHROMYCIN >=8 RESISTANT Resistant     GENTAMICIN <=0.5 SENSITIVE Sensitive     LEVOFLOXACIN 0.25 SENSITIVE Sensitive     OXACILLIN 2 SENSITIVE Sensitive     RIFAMPIN <=0.5 SENSITIVE Sensitive     TRIMETH/SULFA <=10 SENSITIVE Sensitive     VANCOMYCIN 1 SENSITIVE Sensitive     TETRACYCLINE <=1 SENSITIVE Sensitive     MOXIFLOXACIN <=0.25 SENSITIVE Sensitive     * ABUNDANT STAPHYLOCOCCUS AUREUS  Culture, routine-abscess     Status: None   Collection Time: 07/24/15 11:48 AM  Result Value Ref Range Status   Specimen Description ABSCESS RIGHT ANKLE  Final   Special Requests NONE  Final   Gram Stain   Final    NO WBC SEEN RARE SQUAMOUS EPITHELIAL CELLS PRESENT FEW GRAM POSITIVE COCCI IN PAIRS Performed at [facility]    Culture   Final    MODERATE STAPHYLOCOCCUS AUREUS Note: RIFAMPIN AND GENTAMICIN SHOULD NOT BE USED AS SINGLE DRUGS FOR TREATMENT OF STAPH INFECTIONS. This organism DOES NOT demonstrate inducible Clindamycin resistance in vitro. Performed at [facility]    Report Status 07/26/2015 FINAL  Final   Organism ID, Bacteria STAPHYLOCOCCUS AUREUS  Final      Susceptibility   Staphylococcus aureus - MIC*    CLINDAMYCIN <=0.25 SENSITIVE Sensitive     ERYTHROMYCIN >=8 RESISTANT Resistant     GENTAMICIN <=0.5 SENSITIVE Sensitive     LEVOFLOXACIN 0.25 SENSITIVE Sensitive     OXACILLIN 2 SENSITIVE Sensitive     RIFAMPIN <=0.5 SENSITIVE Sensitive     TRIMETH/SULFA <=10 SENSITIVE Sensitive     VANCOMYCIN 1 SENSITIVE Sensitive     TETRACYCLINE <=1 SENSITIVE Sensitive     MOXIFLOXACIN <=0.25 SENSITIVE Sensitive     * MODERATE STAPHYLOCOCCUS AUREUS  MRSA PCR Screening     Status: None   Collection Time: 07/24/15 10:45 PM  Result Value Ref Range Status   MRSA by PCR NEGATIVE  NEGATIVE Final    Comment:        The GeneXpert MRSA Assay (FDA approved for NASAL specimens only), is one component of a comprehensive MRSA colonization surveillance program. It is not intended to diagnose MRSA infection nor to guide or monitor treatment for MRSA infections.      Studies: Dg Ankle Complete Right  07/24/2015   CLINICAL DATA:  Right ankle pain and swelling with a open wound near the medial malleolus.  EXAM: RIGHT ANKLE - COMPLETE 3+ VIEW  COMPARISON:  None.  FINDINGS: There is diffuse soft tissue swelling/edema involving the entire ankle and visualized foot. There are extensive vascular calcifications. No gas is seen in the soft tissues. No destructive bony changes.  Findings suspicious for a nondisplaced medial malleolus fracture. There is  also a small avulsion fracture involving the distal tip of the lateral malleolus.  IMPRESSION: Suspect nondisplaced, possibly healing, medial malleolus fracture.  Possible small avulsion fracture involving the distal tip of the lateral malleolus.  Diffuse subcutaneous soft tissue swelling/ edema without gas in the soft tissues.   Electronically Signed   By: Rudie Meyer M.D.   On: 07/24/2015 14:07   Dg Foot Complete Right  07/24/2015   CLINICAL DATA:  Right foot and ankle swelling with cellulitis right ankle medially  EXAM: RIGHT FOOT COMPLETE - 3+ VIEW  COMPARISON:  None.  FINDINGS: Severe diffuse osteopenia. Diffuse soft tissue swelling over the foot and ankle. Diffuse small vessel calcification. No fracture or dislocation. No periosteal reaction.  IMPRESSION: Soft tissue changes with no acute osseous abnormalities   Electronically Signed   By: Esperanza Heir M.D.   On: 07/24/2015 14:05    Scheduled Meds: . azelastine  2 spray Each Nare BID  . budesonide  0.25 mg Nebulization BID  . cholecalciferol  1,000 Units Oral Daily  . docusate sodium  100 mg Oral BID  . famotidine  20 mg Oral BID  . heparin  5,000 Units Subcutaneous 3 times  per day  . loratadine  10 mg Oral Daily  . pantoprazole  80 mg Oral Q1200  . sertraline  50 mg Oral Daily  . sodium chloride  10 mL Intravenous BID  . vancomycin  500 mg Intravenous Q24H   Continuous Infusions:   Principal Problem:   Cellulitis of right ankle Active Problems:   Chronic depression   Essential hypertension   Allergic rhinitis   Asthma   GERD   Fracture of malleolus, right ankle, closed    Time spent: 35 minutes    [facility] M  [facility] Pager [phone]. If 7PM-7AM, please contact night-coverage at [email or web address], password [facility] 07/26/2015, 9:51 AM  LOS: 2 days

## 2015-07-26 NOTE — Clinical Social Work Note (Signed)
Pt's daughter feels pt will require higher level of care after hospital stay and requests that information be sent to [facility] as this is close to her home. She has already discussed with admissions there. Pt also agreeable and wants to defer to what her children think is best. Confirmed no pasarr needed at [place] facility with admissions. Facility will review referral.   Derenda Fennel, LCSW [phone]

## 2015-07-27 ENCOUNTER — Encounter (HOSPITAL_COMMUNITY): Payer: Self-pay | Admitting: Internal Medicine

## 2015-07-27 DIAGNOSIS — A4901 Methicillin susceptible Staphylococcus aureus infection, unspecified site: Secondary | ICD-10-CM

## 2015-07-27 DIAGNOSIS — S82891S Other fracture of right lower leg, sequela: Secondary | ICD-10-CM

## 2015-07-27 HISTORY — DX: Methicillin susceptible Staphylococcus aureus infection, unspecified site: A49.01

## 2015-07-27 LAB — BASIC METABOLIC PANEL
Anion gap: 4 — ABNORMAL LOW (ref 5–15)
BUN: 9 mg/dL (ref 6–20)
CHLORIDE: 103 mmol/L (ref 101–111)
CO2: 31 mmol/L (ref 22–32)
CREATININE: 0.84 mg/dL (ref 0.44–1.00)
Calcium: 8.7 mg/dL — ABNORMAL LOW (ref 8.9–10.3)
GFR calc Af Amer: >60 mL/min (ref >60)
GFR calc non Af Amer: 58 mL/min — ABNORMAL LOW (ref >60)
Glucose, Bld: 92 mg/dL (ref 65–99)
POTASSIUM: 4.1 mmol/L (ref 3.5–5.1)
SODIUM: 138 mmol/L (ref 135–145)

## 2015-07-27 MED ORDER — HYDROCHLOROTHIAZIDE 12.5 MG PO CAPS
12.5000 mg | ORAL_CAPSULE | Freq: Every day | ORAL | Status: DC
  Administered 2015-07-27 – 2015-07-28 (×2): 12.5 mg via ORAL
  Filled 2015-07-27: qty 1

## 2015-07-27 NOTE — Clinical Social Work Placement (Signed)
   CLINICAL SOCIAL WORK PLACEMENT  NOTE  Date:  07/27/2015  Patient Details  Name: Robin Arroyo MRN: [id number] Date of Birth: 01-02-1919  Clinical Social Work is seeking post-discharge placement for this patient at the Skilled  Nursing Facility level of care (*CSW will initial, date and re-position this form in  chart as items are completed):  No (requesting [place] facility)   Patient/family provided with [place] Clinical Social Work Department's list of facilities offering this level of care within the geographic area requested by the patient (or if unable, by the patient's family).  Yes   Patient/family informed of their freedom to choose among providers that offer the needed level of care, that participate in Medicare, Medicaid or managed care program needed by the patient, have an available bed and are willing to accept the patient.  No   Patient/family informed of [place]'s ownership interest in [facility] and [facility], as well as of the fact that they are under no obligation to receive care at these facilities.  PASRR submitted to EDS on       PASRR number received on       Existing PASRR number confirmed on       FL2 transmitted to all facilities in geographic area requested by pt/family on 07/27/15     FL2 transmitted to all facilities within larger geographic area on       Patient informed that his/her managed care company has contracts with or will negotiate with certain facilities, including the following:        Yes   Patient/family informed of bed offers received.  Patient chooses bed at Other - please specify in the comment section below:     Physician recommends and patient chooses bed at      Patient to be transferred to   on  .  Patient to be transferred to facility by       Patient family notified on   of transfer.  Name of family member notified:        PHYSICIAN       Additional Comment:  [facility] [address] [place]. No  pasarr needed per [place] facility. [facility] has wound doctor who has reviewed notes and can manage pt.   _______________________________________________ Karn Cassis, LCSW 07/27/2015, 9:53 AM

## 2015-07-27 NOTE — Progress Notes (Signed)
Physical Therapy Wound Treatment Patient Details  Name: Robin Arroyo MRN: [id number] Date of Birth: [birth year]  Today's Date: 07/27/2015 Time: [phone]  Subjective  Subjective: Pt reports currently without pain.  States some tenderness in wound when it is messed with. Patient and Family Stated Goals: wants her ankle to heal Date of Onset: 07/20/15 Prior Treatments: none  Pain Score:  0/10  Wound Assessment  Bandage still intact with drainage soaked through.  Pt reported no pain unless "messed with".  Pulse lavage competed to wound followed by repacking with iodoform.  No odor or increase in redness.  Pt tolerated treatment well.        Wound Assessment and Plan  Wound Therapy - Assess/Plan/Recommendations Wound Therapy - Clinical Statement: pt injured the medial right ankle last week on her w/c footrest.  She is no longer ambulatory and mobilizes in her power w/c.  She is found to be alert and oriented, very cooperative and with no pain.  Her ankle also has a mild fx present which will be addressed by MD at a later date.  Currently, the medial ankle is very edemetous with erythema throughout the distal calf and ankle.  There are 2 small open wounds over this area which are draining blood.  With inspection using a Qtip, the wounds communicate and both have tunneling up to 3 cm  in the 12:00 to 2:00 positions.  There is no odor present and pt has good sensation.in the region.  The tissue is warm to the touch.  She received PL to both openings.  We had initially been ordered to use Calcium Alginate as a dressing by [facility] nurse but she was not aware that the wounds tunneled.  I called her and she then recommended Iodoform gauze for packing.  She stated that she would enter the order into the computer. Wound Therapy - Functional Problem List: pt has advanced age and is not ambulatory...one would assume that her circulation is compromised Factors Delaying/Impairing Wound Healing:  Immobility Hydrotherapy Plan: Debridement;Dressing change;Patient/family education;Pulsatile lavage with suction Wound Therapy - Frequency: 6X / week Wound Therapy - Current Recommendations: PT Wound Therapy - Follow Up Recommendations: Skilled nursing facility Wound Plan: PL to wound on medial right ankle followed by debridement as needed, pack with iodoform gauze, cover with 4x4s and kerlix.  Pt is to keep RLE elevated as much as possible.  she is getting OOB with nursing service to use [facility].  We need to encourage her to get up to recliner daily.  Wound Therapy Goals- Improve the function of patient's integumentary system by progressing the wound(s) through the phases of wound healing (inflammation - proliferation - remodeling) by: Decrease Length/Width/Depth by (cm): decrease tunneling of wounds to no more than 2 cm in depth (currently 3 cm) Decrease Length/Width/Depth - Progress: Progressing toward goal Improve Drainage Characteristics: Other (comment) (decrease to scant serosanguinous drainage) Improve Drainage Characteristics - Progress: Progressing toward goal Goals/treatment plan/discharge plan were made with and agreed upon by patient/family: Yes Time For Goal Achievement: 7 days Wound Therapy - Potential for Goals: Good  Goals will be updated until maximal potential achieved or discharge criteria met.  Discharge criteria: when goals achieved, discharge from hospital, MD decision/surgical intervention, no progress towards goals, refusal/missing three consecutive treatments without notification or medical reason.      Teena Irani, PTA/CLT [phone] 07/27/2015, 11:41 AM

## 2015-07-27 NOTE — Progress Notes (Signed)
[facility] PROGRESS NOTE  Robin Arroyo NLZ:[id number] DOB: 1919/09/27 DOA: 07/24/2015 PCP: No primary care provider on file.  Assessment/Plan: 1. Cellulitis of the right ankle/foot and fracture of the right malleolus. Related to multiple scrapes on her legs from the motorized wheelchair. Bulla on medial ankle opened with moderate amount serosanguinous drainage. Evaluated by PT and pulse lavage recommended for tunneling. Less intense erythema and swelling. Less drainage. She remains afebrile and her white blood cell count is within normal limits. Orthopedic surgery following and input appreciated. Wound culture with MSSA so Vancomycin changed to Ancef. Consider narrowing 2. Essential hypertension. Not on antihypertensive medications at home. BP remains high end of normal. Chart review indicates allergy to ACE inhibitor. Will add low dose HCTZ. Will monitor closely 3. Chronic GERD. Remains stable at baseline. Continue Zantac and Nexium.  4. Depression. remains stable. We'll continue Zoloft. 5. Seasonal allergies/allergic rhinitis. remains stable. We'll continue anti-histamine and nasal spray.   Code Status: DNR Family Communication: Daughter Robin Arroyo updated per phone Disposition Plan: hopefully tomorrow   Consultants:  Orthopedic surgery  Procedures:  Pulse lavage to right ankle wound  Antibiotics:  Vancomycin 07/24/15-07/26/15  Ancef 07/26/15>>  HPI/Subjective: Sitting up in bed eating. Reports feeling "ok" denies pain/discomfort  Objective: Filed Vitals:   07/27/15 0500  BP: 166/87  Pulse: 87  Temp: 98.7 F (37.1 C)  Resp: 16    Intake/Output Summary (Last 24 hours) at 07/27/15 1014 Last data filed at 07/27/15 0846  Gross per 24 hour  Intake    480 ml  Output    100 ml  Net    380 ml   Filed Weights   07/24/15 1141 07/24/15 1428  Weight: 58.968 kg (130 lb) 65.3 kg (143 lb 15.4 oz)    Exam:   General: calm appears comfortable very HOH    Cardiovascular: rrr, no m/g/r  Less swelling bilateral LE  Respiratory: normal effort BS clear bilaterally no wheeze  Abdomen: obese non-distended +BS, non-tender to palpation  Musculoskeletal: dressing right foot/ankle with bloody drainage medial aspect ankle. Less erythema. No odor   Data Reviewed: Basic Metabolic Panel:  Recent Labs Lab 07/24/15 1211 07/25/15 0810 07/27/15 0725  NA 138 139 138  K 4.1 4.4 4.1  CL 101 104 103  CO2 32 31 31  GLUCOSE 97 97 92  BUN 12 10 9   CREATININE 0.78 0.70 0.84  CALCIUM 9.0 8.7* 8.7*   Liver Function Tests:  Recent Labs Lab 07/24/15 1211  AST 17  ALT 9*  ALKPHOS 41  BILITOT 0.5  PROT 6.7  ALBUMIN 3.0*   No results for input(s): LIPASE, AMYLASE in the last 168 hours. No results for input(s): AMMONIA in the last 168 hours. CBC:  Recent Labs Lab 07/24/15 1211 07/25/15 0810 07/26/15 0738  WBC 10.0 10.1 8.9  NEUTROABS 7.1  --   --   HGB 11.6* 11.1* 11.4*  HCT 36.2 34.8* 35.6*  MCV 94.0 93.5 93.4  PLT 344 345 361   Cardiac Enzymes: No results for input(s): CKTOTAL, CKMB, CKMBINDEX, TROPONINI in the last 168 hours. BNP (last 3 results) No results for input(s): BNP in the last 8760 hours.  ProBNP (last 3 results) No results for input(s): PROBNP in the last 8760 hours.  CBG: No results for input(s): GLUCAP in the last 168 hours.  Recent Results (from the past 240 hour(s))  Culture, routine-abscess     Status: None   Collection Time: 07/24/15 11:45 AM  Result Value Ref Range Status  Specimen Description ABSCESS RIGHT ANKLE ANTERIOR ASPECT  Final   Special Requests NONE  Final   Gram Stain   Final    NO WBC SEEN FEW SQUAMOUS EPITHELIAL CELLS PRESENT MODERATE GRAM POSITIVE COCCI IN PAIRS IN CLUSTERS Performed at [facility]    Culture   Final    ABUNDANT STAPHYLOCOCCUS AUREUS Note: RIFAMPIN AND GENTAMICIN SHOULD NOT BE USED AS SINGLE DRUGS FOR TREATMENT OF STAPH INFECTIONS. This organism DOES NOT  demonstrate inducible Clindamycin resistance in vitro. Performed at [facility]    Report Status 07/26/2015 FINAL  Final   Organism ID, Bacteria STAPHYLOCOCCUS AUREUS  Final      Susceptibility   Staphylococcus aureus - MIC*    CLINDAMYCIN <=0.25 SENSITIVE Sensitive     ERYTHROMYCIN >=8 RESISTANT Resistant     GENTAMICIN <=0.5 SENSITIVE Sensitive     LEVOFLOXACIN 0.25 SENSITIVE Sensitive     OXACILLIN 2 SENSITIVE Sensitive     RIFAMPIN <=0.5 SENSITIVE Sensitive     TRIMETH/SULFA <=10 SENSITIVE Sensitive     VANCOMYCIN 1 SENSITIVE Sensitive     TETRACYCLINE <=1 SENSITIVE Sensitive     MOXIFLOXACIN <=0.25 SENSITIVE Sensitive     * ABUNDANT STAPHYLOCOCCUS AUREUS  Culture, routine-abscess     Status: None   Collection Time: 07/24/15 11:48 AM  Result Value Ref Range Status   Specimen Description ABSCESS RIGHT ANKLE  Final   Special Requests NONE  Final   Gram Stain   Final    NO WBC SEEN RARE SQUAMOUS EPITHELIAL CELLS PRESENT FEW GRAM POSITIVE COCCI IN PAIRS Performed at [facility]    Culture   Final    MODERATE STAPHYLOCOCCUS AUREUS Note: RIFAMPIN AND GENTAMICIN SHOULD NOT BE USED AS SINGLE DRUGS FOR TREATMENT OF STAPH INFECTIONS. This organism DOES NOT demonstrate inducible Clindamycin resistance in vitro. Performed at [facility]    Report Status 07/26/2015 FINAL  Final   Organism ID, Bacteria STAPHYLOCOCCUS AUREUS  Final      Susceptibility   Staphylococcus aureus - MIC*    CLINDAMYCIN <=0.25 SENSITIVE Sensitive     ERYTHROMYCIN >=8 RESISTANT Resistant     GENTAMICIN <=0.5 SENSITIVE Sensitive     LEVOFLOXACIN 0.25 SENSITIVE Sensitive     OXACILLIN 2 SENSITIVE Sensitive     RIFAMPIN <=0.5 SENSITIVE Sensitive     TRIMETH/SULFA <=10 SENSITIVE Sensitive     VANCOMYCIN 1 SENSITIVE Sensitive     TETRACYCLINE <=1 SENSITIVE Sensitive     MOXIFLOXACIN <=0.25 SENSITIVE Sensitive     * MODERATE STAPHYLOCOCCUS AUREUS  MRSA PCR Screening     Status: None    Collection Time: 07/24/15 10:45 PM  Result Value Ref Range Status   MRSA by PCR NEGATIVE NEGATIVE Final    Comment:        The GeneXpert MRSA Assay (FDA approved for NASAL specimens only), is one component of a comprehensive MRSA colonization surveillance program. It is not intended to diagnose MRSA infection nor to guide or monitor treatment for MRSA infections.      Studies: No results found.  Scheduled Meds: . azelastine  2 spray Each Nare BID  . budesonide  0.25 mg Nebulization BID  .  ceFAZolin (ANCEF) IV  1 g Intravenous 3 times per day  . cholecalciferol  1,000 Units Oral Daily  . docusate sodium  100 mg Oral BID  . famotidine  20 mg Oral BID  . feeding supplement (ENSURE ENLIVE)  237 mL Oral BID BM  . heparin  5,000 Units Subcutaneous 3 times per day  . loratadine  10 mg Oral Daily  . pantoprazole  80 mg Oral Q1200  . sertraline  50 mg Oral Daily  . sodium chloride  10 mL Intravenous BID   Continuous Infusions:   Principal Problem:   Cellulitis of right ankle Active Problems:   Chronic depression   Essential hypertension   Allergic rhinitis   Asthma   GERD   Fracture of malleolus, right ankle, closed    Time spent: 35 minutes    [facility] M  [facility] Pager [phone]. If 7PM-7AM, please contact night-coverage at [email or web address], password [facility] 07/27/2015, 10:14 AM  LOS: 3 days

## 2015-07-27 NOTE — Progress Notes (Signed)
Subjective: I am tired.   Objective: Vital signs in last 24 hours: Temp:  [98.2 F (36.8 C)-98.9 F (37.2 C)] 98.7 F (37.1 C) (09/21 0500) Pulse Rate:  [84-90] 87 (09/21 0500) Resp:  [16] 16 (09/21 0500) BP: (136-166)/(67-87) 166/87 mmHg (09/21 0500) SpO2:  [93 %-99 %] 97 % (09/21 0734)  Intake/Output from previous day: 09/20 0701 - 09/21 0700 In: 360 [P.O.:360] Out: 100 [Urine:100] Intake/Output this shift:     Recent Labs  07/24/15 1211 07/25/15 0810 07/26/15 0738  HGB 11.6* 11.1* 11.4*    Recent Labs  07/25/15 0810 07/26/15 0738  WBC 10.1 8.9  RBC 3.72* 3.81*  HCT 34.8* 35.6*  PLT 345 361    Recent Labs  07/24/15 1211 07/25/15 0810  NA 138 139  K 4.1 4.4  CL 101 104  CO2 32 31  BUN 12 10  CREATININE 0.78 0.70  GLUCOSE 97 97  CALCIUM 9.0 8.7*   No results for input(s): LABPT, INR in the last 72 hours.  Neurologically intact Intact pulses distally Dorsiflexion/Plantar flexion intact   Her erythema is perhaps a little less intense today.  There has been no further progression of the extent of the redness.  Her pain is less.  She had wound care done yesterday and tolerated it well.  She has decreased sensation to the foot/ankle and it did not bother her to have it done.    Assessment/Plan: Cellulitis of the right medial ankle with concurrent bimalleolar fractures of the ankle nondisplaced.    Continue present course.     KEELING,WAYNE 07/27/2015, 7:52 AM

## 2015-07-28 ENCOUNTER — Encounter (HOSPITAL_COMMUNITY): Payer: Self-pay | Admitting: Internal Medicine

## 2015-07-28 LAB — CBC
HEMATOCRIT: 33.9 % — AB (ref 36.0–46.0)
HEMOGLOBIN: 10.9 g/dL — AB (ref 12.0–15.0)
MCH: 29.9 pg (ref 26.0–34.0)
MCHC: 32.2 g/dL (ref 30.0–36.0)
MCV: 92.9 fL (ref 78.0–100.0)
Platelets: 369 10*3/uL (ref 150–400)
RBC: 3.65 MIL/uL — AB (ref 3.87–5.11)
RDW: 13.1 % (ref 11.5–15.5)
WBC: 6.7 10*3/uL (ref 4.0–10.5)

## 2015-07-28 LAB — BASIC METABOLIC PANEL
ANION GAP: 4 — AB (ref 5–15)
BUN: 9 mg/dL (ref 6–20)
CO2: 32 mmol/L (ref 22–32)
Calcium: 8.5 mg/dL — ABNORMAL LOW (ref 8.9–10.3)
Chloride: 103 mmol/L (ref 101–111)
Creatinine, Ser: 0.83 mg/dL (ref 0.44–1.00)
GFR, EST NON AFRICAN AMERICAN: 59 mL/min — AB (ref >60)
GLUCOSE: 87 mg/dL (ref 65–99)
POTASSIUM: 3.9 mmol/L (ref 3.5–5.1)
Sodium: 139 mmol/L (ref 135–145)

## 2015-07-28 MED ORDER — ONDANSETRON HCL 4 MG PO TABS: 4.0000 mg | ORAL_TABLET | Freq: Three times a day (TID) | ORAL | Status: AC | PRN

## 2015-07-28 MED ORDER — TRAMADOL HCL 50 MG PO TABS: 50.0000 mg | ORAL_TABLET | Freq: Four times a day (QID) | ORAL | Status: AC | PRN

## 2015-07-28 MED ORDER — HYDROCHLOROTHIAZIDE 12.5 MG PO CAPS: 12.5000 mg | ORAL_CAPSULE | Freq: Every day | ORAL | Status: AC

## 2015-07-28 MED ORDER — ENOXAPARIN SODIUM 30 MG/0.3ML ~~LOC~~ SOLN: 30.0000 mg | SUBCUTANEOUS | Status: AC

## 2015-07-28 MED ORDER — LEVOFLOXACIN 500 MG PO TABS: 500.0000 mg | ORAL_TABLET | Freq: Every day | ORAL | Status: AC

## 2015-07-28 MED ORDER — DOCUSATE SODIUM 100 MG PO CAPS: 100.0000 mg | ORAL_CAPSULE | Freq: Two times a day (BID) | ORAL | Status: AC

## 2015-07-28 MED ORDER — ENSURE ENLIVE PO LIQD: 237.0000 mL | Freq: Two times a day (BID) | ORAL | Status: AC

## 2015-07-28 MED ORDER — ESOMEPRAZOLE MAGNESIUM 40 MG PO CPDR: 40.0000 mg | DELAYED_RELEASE_CAPSULE | Freq: Two times a day (BID) | ORAL | Status: AC

## 2015-07-28 NOTE — Care Management Important Message (Signed)
Important Message  Patient Details  Name: Robin Arroyo MRN: [id number] Date of Birth: 12/26/1918   Medicare Important Message Given:  Yes-second notification given    Cheryl Flash, RN 07/28/2015, 1:07 PM

## 2015-07-28 NOTE — Clinical Social Work Placement (Signed)
   CLINICAL SOCIAL WORK PLACEMENT  NOTE  Date:  07/28/2015  Patient Details  Name: Robin Arroyo MRN: [id number] Date of Birth: [birth year]  Clinical Social Work is seeking post-discharge placement for this patient at the Skilled  Nursing Facility level of care (*CSW will initial, date and re-position this form in  chart as items are completed):  No (requesting [place] facility)   Patient/family provided with [facility] Clinical Social Work Department's list of facilities offering this level of care within the geographic area requested by the patient (or if unable, by the patient's family).  Yes   Patient/family informed of their freedom to choose among providers that offer the needed level of care, that participate in Medicare, Medicaid or managed care program needed by the patient, have an available bed and are willing to accept the patient.  No   Patient/family informed of [facility]'s ownership interest in [facility] and [facility], as well as of the fact that they are under no obligation to receive care at these facilities.  PASRR submitted to EDS on       PASRR number received on       Existing PASRR number confirmed on       FL2 transmitted to all facilities in geographic area requested by pt/family on 07/27/15     FL2 transmitted to all facilities within larger geographic area on       Patient informed that his/her managed care company has contracts with or will negotiate with certain facilities, including the following:        Yes   Patient/family informed of bed offers received.  Patient chooses bed at Other - please specify in the comment section below:     Physician recommends and patient chooses bed at      Patient to be transferred to Other - please specify in the comment section below: on 07/28/15.  Patient to be transferred to facility by [facility]     Patient family notified on 07/28/15 of transfer.  Name of family member notified:  June-  daughter     PHYSICIAN       Additional Comment:   [facility] [address]- [place].  _______________________________________________ Karn Cassis, LCSW 07/28/2015, 1:45 PM

## 2015-07-28 NOTE — Progress Notes (Signed)
Physical Therapy Wound Treatment Patient Details  Name: ABBE BULA MRN: [id number] Date of Birth: 29-Aug-1919  Today's Date: 07/28/2015 Time: 9449-6759 Time Calculation (min): 41 min  Subjective  Subjective: No c/o pain, feels better. Patient and Family Stated Goals: wants her ankle to heal Date of Onset: 07/20/15 Prior Treatments: none  Pain Score:    Wound Assessment   The medial region of the right ankle has a significant decrease of erythema and edema.  Her pain with treatment is lessened.  There continue to be two openings that communicate and tunnel to 2.3 cm.  There has been a moderate amount of serosanguinous drainage over the past day.  She received PL to these openings followed by packing with Iodoform guaze.  The ankle was then covered with ABD pad and Kerlix.  She tolerated this well.       Wound Assessment and Plan  Wound Therapy - Assess/Plan/Recommendations Wound Therapy - Clinical Statement: pt injured the medial right ankle last week on her w/c footrest.  She is no longer ambulatory and mobilizes in her power w/c.  She is found to be alert and oriented, very cooperative and with no pain.  Her ankle also has a mild fx present which will be addressed by MD at a later date.  Currently, the medial ankle is very edemetous with erythema throughout the distal calf and ankle.  There are 2 small open wounds over this area which are draining blood.  With inspection using a Qtip, the wounds communicate and both have tunneling up to 3 cm  in the 12:00 to 2:00 positions.  There is no odor present and pt has good sensation.in the region.  The tissue is warm to the touch.  She received PL to both openings.  We had initially been ordered to use Calcium Alginate as a dressing by [facility] nurse but she was not aware that the wounds tunneled.  I called her and she then recommended Iodoform gauze for packing.  She stated that she would enter the order into the computer. Wound Therapy - Functional  Problem List: pt has advanced age and is not ambulatory...one would assume that her circulation is compromised Factors Delaying/Impairing Wound Healing: Immobility Hydrotherapy Plan: Debridement;Dressing change;Patient/family education;Pulsatile lavage with suction Wound Therapy - Frequency: 6X / week Wound Therapy - Current Recommendations: PT Wound Therapy - Follow Up Recommendations: Skilled nursing facility Wound Plan: PL to wound on medial right ankle followed by debridement as needed, pack with iodoform gauze, cover with 4x4s and kerlix.  Pt is to keep RLE elevated as much as possible.  she is getting OOB with nursing service to use [facility].  We need to encourage her to get up to recliner daily.  Wound Therapy Goals- Improve the function of patient's integumentary system by progressing the wound(s) through the phases of wound healing (inflammation - proliferation - remodeling) by: Decrease Length/Width/Depth - Progress: Progressing toward goal Improve Drainage Characteristics - Progress: Progressing toward goal  Goals will be updated until maximal potential achieved or discharge criteria met.  Discharge criteria: when goals achieved, discharge from hospital, MD decision/surgical intervention, no progress towards goals, refusal/missing three consecutive treatments without notification or medical reason.  GP     Demetrios Isaacs L  PT 07/28/2015, 12:48 PM [phone]

## 2015-07-28 NOTE — Progress Notes (Signed)
Subjective: I am fine today   Objective: Vital signs in last 24 hours: Temp:  [97.6 F (36.4 C)-98.5 F (36.9 C)] 97.6 F (36.4 C) (09/22 0552) Pulse Rate:  [73-79] 73 (09/22 0552) Resp:  [16-17] 16 (09/22 0552) BP: (127-146)/(52-85) 146/85 mmHg (09/21 2228) SpO2:  [94 %-97 %] 94 % (09/22 0720)  Intake/Output from previous day: 09/21 0701 - 09/22 0700 In: 720 [P.O.:720] Out: 1 [Urine:1] Intake/Output this shift: Total I/O In: 240 [P.O.:240] Out: -    Recent Labs  07/26/15 0738 07/28/15 0641  HGB 11.4* 10.9*    Recent Labs  07/26/15 0738 07/28/15 0641  WBC 8.9 6.7  RBC 3.81* 3.65*  HCT 35.6* 33.9*  PLT 361 369    Recent Labs  07/27/15 0725 07/28/15 0641  NA 138 139  K 4.1 3.9  CL 103 103  CO2 31 32  BUN 9 9  CREATININE 0.84 0.83  GLUCOSE 92 87  CALCIUM 8.7* 8.5*   No results for input(s): LABPT, INR in the last 72 hours.  Neurologically intact Neurovascular intact Sensation intact distally Intact pulses distally Dorsiflexion/Plantar flexion intact   She had her wound debrieded and packed just a short while ago.  She has little pain.  She still has some erythema of the right medial ankle but a little less.    Assessment/Plan: She could go to SNF and continue antibiotics.  She could be placed on oral medicine.  I can see her in the office next week.  I will be out of town October 1 through 10th.  She can get in chair with CAM walker.  I would continue the enoxaparin daily for at least a month.  Wound care should continue with either PT or wound care nurse.  Thanks.  Re-consult as needed.   KEELING,WAYNE 07/28/2015, 11:26 AM

## 2015-07-28 NOTE — Progress Notes (Signed)
@  [email or web address]@Pt . discharged home today. Pt. Received discharge instructions, prescriptions and care notes. Pt. Verbalized understanding and has no questions or concerns at this time. Pt.'s IV removed with catheter intact,no bleeding or complications. Heart monitor removed and central tele notified. Pt. Left unit in stable condition in wheelchair with staff member.

## 2015-07-28 NOTE — Discharge Summary (Signed)
Physician Discharge Summary  Robin Arroyo OXB:[id number] DOB: 1918-12-28 DOA: 07/24/2015  PCP: No primary care provider on file.  Admit date: 07/24/2015 Discharge date: 07/28/2015  Time spent: 40  minutes  Recommendations for Outpatient Follow-up:  1. Dr Hilda Lias 1 week for evaluation of right ankle fracture 2. Discharge to [facility]. Skin care as noted below. Monitor BP as HCTZ started. BMET 1 week track potassium level given that low dose HCTZ started.     Discharge Diagnoses:  Principal Problem:   Cellulitis of right ankle Active Problems:   Chronic depression   Essential hypertension   Allergic rhinitis   Asthma   GERD   Fracture of malleolus, right ankle, closed   MSSA (methicillin susceptible Staphylococcus aureus)   Discharge Condition: stabke  Diet recommendation: Dysphagia 3 thin liquid  Filed Weights   07/24/15 1141 07/24/15 1428  Weight: 58.968 kg (130 lb) 65.3 kg (143 lb 15.4 oz)    History of present illness:  Robin Arroyo is a 81 y.o. female with a history of seasonal allergies, HTN, depression with anxiety, DJD, and rheumatoid arthritis. She is practically nonambulatory and gets around in a motorized wheelchair. The history was provided by both the patient and her daughter-in-law, Lynden Ang. She [facility] from [facility] assisted-living facility on 07/24/15 with a chief complaint of right ankle pain and swelling. Approximately 3 days prior, she scraped her right ankle on her wheelchair. Lynden Ang stated that the patient had difficulty driving the motorized wheelchair and frequently runs into the wall, scrapes her legs and her arms on the wheelchair, and had on a couple occasions became pinned to the wall by the wheelchair. When she attempts to get up from the wheelchair onto the bed or bedside commode she forgets to pause/break the wheelchair. Patient reported that following the scrape, her right ankle became painful and swollen. It became  progressively red and more painful, particularly when she tried to stand. Over the previous 24 hours, it began draining yellowish looking fluid which did not have much of an odor. The redness had migrated from her right ankle to her mid leg. She denied subjective fever, chills, chest pain, chest congestion, nausea, vomiting, or diarrhea. She had more sleepiness over the previous 24 hours.  In the ED, she was moderately hypertensive with a blood pressure 172/75. She was afebrile and otherwise hemodynamically stable. Her lab data were significant for normal W BC of 10.0, mild anemia with a hemoglobin of 11.6, otherwise her laboratory studies were unremarkable. X-ray of her right ankle/foot revealed soft tissue changes and a suspicion of a small avulsion fracture involving the distal tip of the lateral malleolus and a suspicion of a nondisplaced possibly healing medial malleolus fracture.   Hospital Course:  1. Cellulitis of the right ankle/foot and fracture of the right malleolus. Related to multiple scrapes on her legs from the motorized wheelchair. [place] on medial ankle opened with moderate amount serosanguinous drainage. Evaluated by PT and pulse lavage recommended for tunneling as well as packing. At discharge much improved with less erythema and swelling. Less drainage. She remained afebrile and her white blood cell count remained within normal limits. She was provided with Vancomycin initially and this was changed to Ancef due to wound culture with MSSA. At discharge will be provided with Levaquin for 4 days. Orthopedic surgery evaluated and recommended CAM walker and elevation of right leg as much as possible as well as lovenox daily for 1 month. She will follow up with Dr Hilda Lias  in 1 week 2. Essential hypertension. Not on antihypertensive medications at home. BP remained high end of normal so low dose HCTZ started with improved control.  Chart review indicated allergy to ACE inhibitor. Recommend  monitoring BP BID for 2 weeks to evaluate control. Recommend BMET 1 week. Potassium level 3.9 at discharge  3. Chronic GERD. Remained stable at baseline. .  4. Depression. remained stable. . 5. Seasonal allergies/allergic rhinitis. remained stable.   Procedures:  Pulse lavage 9/21 and 9/22  Consultations:  Orthopedic surgery Dr Hilda Lias  Discharge Exam: Filed Vitals:   07/28/15 0552  BP:   Pulse: 73  Temp: 97.6 F (36.4 C)  Resp: 16    General: well nourished appears comfortable Cardiovascular: RRR no MGR bilateral LE swelling improving  Respiratory: normal effort BS clear to ausculatation Skin: Medial region of right ankle with much less erythema/edema. Two openings with tunnel. Moderate amount drainage no odor. Less pain. No heat  Discharge Instructions   Discharge Instructions    Change dressing (specify)    Complete by:  As directed   Dressing change: daily 6x/week. PL to wound on medial right ankle followed by debridement as needed. Pack with iodoform gauze, cover 4x4 and kerlex     Diet - low sodium heart healthy    Complete by:  As directed      Discharge instructions    Complete by:  As directed   PL to wound on medial right ankle followed by debridement as needed, pack with iodoform gauze, cover with 4x4s and kerlix 6x weekly. Pt is to keep RLE elevated as much as possible.  CAM walker     Increase activity slowly    Complete by:  As directed           Current Discharge Medication List    START taking these medications   Details  docusate sodium (COLACE) 100 MG capsule Take 1 capsule (100 mg total) by mouth 2 (two) times daily. Qty: 10 capsule, Refills: 0    enoxaparin (LOVENOX) 30 MG/0.3ML injection Inject 0.3 mLs (30 mg total) into the skin daily. Qty: 30 Syringe, Refills: 0    feeding supplement, ENSURE ENLIVE, (ENSURE ENLIVE) LIQD Take 237 mLs by mouth 2 (two) times daily between meals. Qty: 237 mL, Refills: 12    hydrochlorothiazide  (MICROZIDE) 12.5 MG capsule Take 1 capsule (12.5 mg total) by mouth daily. Qty: 30 capsule, Refills: 0      CONTINUE these medications which have CHANGED   Details  esomeprazole (NEXIUM) 40 MG capsule Take 1 capsule (40 mg total) by mouth 2 (two) times daily.    levofloxacin (LEVAQUIN) 500 MG tablet Take 1 tablet (500 mg total) by mouth daily. Qty: 5 tablet, Refills: 0    ondansetron (ZOFRAN) 4 MG tablet Take 1 tablet (4 mg total) by mouth every 8 (eight) hours as needed for nausea or vomiting.    traMADol (ULTRAM) 50 MG tablet Take 1 tablet (50 mg total) by mouth every 6 (six) hours as needed. for pain Qty: 60 tablet, Refills: 5      CONTINUE these medications which have NOT CHANGED   Details  acetaminophen (TYLENOL) 500 MG tablet Take 1,000 mg by mouth every 6 (six) hours as needed for mild pain, fever or headache (fever up to 101 degrees).    azelastine (ASTELIN) 137 MCG/SPRAY nasal spray Place 2 sprays into the nose 2 (two) times daily. Use in each nostril as directed    Cholecalciferol (VITAMIN D) 1000 UNITS  capsule Take 1,000 Units by mouth daily.      fluticasone (FLOVENT HFA) 44 MCG/ACT inhaler Inhale 2 puffs into the lungs 2 (two) times daily.      levocetirizine (XYZAL) 5 MG tablet Take 5 mg by mouth daily.      ranitidine (ZANTAC) 75 MG tablet Take 75 mg by mouth 2 (two) times daily.    sertraline (ZOLOFT) 50 MG tablet TAKE 1 TABLET EVERY DAY Qty: 30 tablet, Refills: 5    levalbuterol (XOPENEX) 1.25 MG/3ML nebulizer solution Take 1 ampule by nebulization every 4 (four) hours as needed for shortness of breath.     polyethylene glycol (MIRALAX) powder Take 17 g by mouth daily as needed for mild constipation. 17 gm in water by mouth once daily as needed      STOP taking these medications     clotrimazole-betamethasone (LOTRISONE) cream      diphenhydrAMINE (BENADRYL) 25 MG tablet      HYDROcodone-homatropine (HYCODAN) 5-1.5 MG/5ML syrup      hydrOXYzine  (ATARAX/VISTARIL) 25 MG tablet      predniSONE (DELTASONE) 10 MG tablet      triamcinolone cream (KENALOG) 0.1 %        Allergies  Allergen Reactions  . Ace Inhibitors Other (See Comments)    rash  . Alendronate Sodium     REACTION: constipation  . Citalopram Hydrobromide Other (See Comments)    Unknown reaction  . Hydrocodone Nausea Only  . Ibandronate Sodium     REACTION: gi upset  . Mirtazapine Other (See Comments)    Unknown reaction    Follow-up Information    Follow up with Darreld Mclean, MD. Schedule an appointment as soon as possible for a visit in 1 week.   Specialty:  Orthopedic Surgery   Why:  follow up righ ankle fracture   Contact information:   [address] [place] [address] [phone]        The results of significant diagnostics from this hospitalization (including imaging, microbiology, ancillary and laboratory) are listed below for reference.    Significant Diagnostic Studies: Dg Ankle Complete Right  07/24/2015   CLINICAL DATA:  Right ankle pain and swelling with a open wound near the medial malleolus.  EXAM: RIGHT ANKLE - COMPLETE 3+ VIEW  COMPARISON:  None.  FINDINGS: There is diffuse soft tissue swelling/edema involving the entire ankle and visualized foot. There are extensive vascular calcifications. No gas is seen in the soft tissues. No destructive bony changes.  Findings suspicious for a nondisplaced medial malleolus fracture. There is also a small avulsion fracture involving the distal tip of the lateral malleolus.  IMPRESSION: Suspect nondisplaced, possibly healing, medial malleolus fracture.  Possible small avulsion fracture involving the distal tip of the lateral malleolus.  Diffuse subcutaneous soft tissue swelling/ edema without gas in the soft tissues.   Electronically Signed   By: Rudie Meyer M.D.   On: 07/24/2015 14:07   Dg Foot Complete Right  07/24/2015   CLINICAL DATA:  Right foot and ankle swelling with cellulitis right  ankle medially  EXAM: RIGHT FOOT COMPLETE - 3+ VIEW  COMPARISON:  None.  FINDINGS: Severe diffuse osteopenia. Diffuse soft tissue swelling over the foot and ankle. Diffuse small vessel calcification. No fracture or dislocation. No periosteal reaction.  IMPRESSION: Soft tissue changes with no acute osseous abnormalities   Electronically Signed   By: Esperanza Heir M.D.   On: 07/24/2015 14:05    Microbiology: Recent Results (from the past 240 hour(s))  Culture, routine-abscess     Status: None   Collection Time: 07/24/15 11:45 AM  Result Value Ref Range Status   Specimen Description ABSCESS RIGHT ANKLE ANTERIOR ASPECT  Final   Special Requests NONE  Final   Gram Stain   Final    NO WBC SEEN FEW SQUAMOUS EPITHELIAL CELLS PRESENT MODERATE GRAM POSITIVE COCCI IN PAIRS IN CLUSTERS Performed at [facility]    Culture   Final    ABUNDANT STAPHYLOCOCCUS AUREUS Note: RIFAMPIN AND GENTAMICIN SHOULD NOT BE USED AS SINGLE DRUGS FOR TREATMENT OF STAPH INFECTIONS. This organism DOES NOT demonstrate inducible Clindamycin resistance in vitro. Performed at [facility]    Report Status 07/26/2015 FINAL  Final   Organism ID, Bacteria STAPHYLOCOCCUS AUREUS  Final      Susceptibility   Staphylococcus aureus - MIC*    CLINDAMYCIN <=0.25 SENSITIVE Sensitive     ERYTHROMYCIN >=8 RESISTANT Resistant     GENTAMICIN <=0.5 SENSITIVE Sensitive     LEVOFLOXACIN 0.25 SENSITIVE Sensitive     OXACILLIN 2 SENSITIVE Sensitive     RIFAMPIN <=0.5 SENSITIVE Sensitive     TRIMETH/SULFA <=10 SENSITIVE Sensitive     VANCOMYCIN 1 SENSITIVE Sensitive     TETRACYCLINE <=1 SENSITIVE Sensitive     MOXIFLOXACIN <=0.25 SENSITIVE Sensitive     * ABUNDANT STAPHYLOCOCCUS AUREUS  Culture, routine-abscess     Status: None   Collection Time: 07/24/15 11:48 AM  Result Value Ref Range Status   Specimen Description ABSCESS RIGHT ANKLE  Final   Special Requests NONE  Final   Gram Stain   Final    NO WBC  SEEN RARE SQUAMOUS EPITHELIAL CELLS PRESENT FEW GRAM POSITIVE COCCI IN PAIRS Performed at [facility]    Culture   Final    MODERATE STAPHYLOCOCCUS AUREUS Note: RIFAMPIN AND GENTAMICIN SHOULD NOT BE USED AS SINGLE DRUGS FOR TREATMENT OF STAPH INFECTIONS. This organism DOES NOT demonstrate inducible Clindamycin resistance in vitro. Performed at [facility]    Report Status 07/26/2015 FINAL  Final   Organism ID, Bacteria STAPHYLOCOCCUS AUREUS  Final      Susceptibility   Staphylococcus aureus - MIC*    CLINDAMYCIN <=0.25 SENSITIVE Sensitive     ERYTHROMYCIN >=8 RESISTANT Resistant     GENTAMICIN <=0.5 SENSITIVE Sensitive     LEVOFLOXACIN 0.25 SENSITIVE Sensitive     OXACILLIN 2 SENSITIVE Sensitive     RIFAMPIN <=0.5 SENSITIVE Sensitive     TRIMETH/SULFA <=10 SENSITIVE Sensitive     VANCOMYCIN 1 SENSITIVE Sensitive     TETRACYCLINE <=1 SENSITIVE Sensitive     MOXIFLOXACIN <=0.25 SENSITIVE Sensitive     * MODERATE STAPHYLOCOCCUS AUREUS  MRSA PCR Screening     Status: None   Collection Time: 07/24/15 10:45 PM  Result Value Ref Range Status   MRSA by PCR NEGATIVE NEGATIVE Final    Comment:        The GeneXpert MRSA Assay (FDA approved for NASAL specimens only), is one component of a comprehensive MRSA colonization surveillance program. It is not intended to diagnose MRSA infection nor to guide or monitor treatment for MRSA infections.      Labs: Basic Metabolic Panel:  Recent Labs Lab 07/24/15 1211 07/25/15 0810 07/27/15 0725 07/28/15 0641  NA 138 139 138 139  K 4.1 4.4 4.1 3.9  CL 101 104 103 103  CO2 32 31 31 32  GLUCOSE 97 97 92 87  BUN 12 10 9 9   CREATININE 0.78 0.70  0.84 0.83  CALCIUM 9.0 8.7* 8.7* 8.5*   Liver Function Tests:  Recent Labs Lab 07/24/15 1211  AST 17  ALT 9*  ALKPHOS 41  BILITOT 0.5  PROT 6.7  ALBUMIN 3.0*   No results for input(s): LIPASE, AMYLASE in the last 168 hours. No results for input(s): AMMONIA in the  last 168 hours. CBC:  Recent Labs Lab 07/24/15 1211 07/25/15 0810 07/26/15 0738 07/28/15 0641  WBC 10.0 10.1 8.9 6.7  NEUTROABS 7.1  --   --   --   HGB 11.6* 11.1* 11.4* 10.9*  HCT 36.2 34.8* 35.6* 33.9*  MCV 94.0 93.5 93.4 92.9  PLT 344 345 361 369   Cardiac Enzymes: No results for input(s): CKTOTAL, CKMB, CKMBINDEX, TROPONINI in the last 168 hours. BNP: BNP (last 3 results) No results for input(s): BNP in the last 8760 hours.  ProBNP (last 3 results) No results for input(s): PROBNP in the last 8760 hours.  CBG: No results for input(s): GLUCAP in the last 168 hours.     SignedGwenyth Bender  [facility] 07/28/2015, 1:35 PM

## 2015-07-28 NOTE — Care Management Note (Signed)
Case Management Note  Patient Details  Name: MICHALA DEBLANC MRN: [id number] Date of Birth: Mar 31, 1919  Subjective/Objective:                    Action/Plan:   Expected Discharge Date:                  Expected Discharge Plan:  Skilled Nursing Facility  In-House Referral:  Clinical Social Work  Discharge planning Services  CM Consult  Post Acute Care Choice:  NA Choice offered to:  NA  DME Arranged:    DME Agency:     HH Arranged:    HH Agency:     Status of Service:  Completed, signed off  Medicare Important Message Given:    Date Medicare IM Given:    Medicare IM give by:    Date Additional Medicare IM Given:    Additional Medicare Important Message give by:     If discussed at Long Length of Stay Meetings, dates discussed:    Additional Comments: Pt discharged to [facility] SNF today. CSW to arrange discharge to facility. Arlyss [place], RN 07/28/2015, 1:06 PM

## 2019-09-06 DEATH — deceased
